# Patient Record
Sex: Male | Born: 1975 | Race: White | Hispanic: No | Marital: Married | Smoking: Current every day smoker
Health system: Southern US, Community
[De-identification: ages and names within clinical notes are randomized; demographics above are authoritative.]

## PROBLEM LIST (undated history)

## (undated) DIAGNOSIS — H409 Unspecified glaucoma: Secondary | ICD-10-CM

## (undated) DIAGNOSIS — J449 Chronic obstructive pulmonary disease, unspecified: Secondary | ICD-10-CM

## (undated) DIAGNOSIS — J45909 Unspecified asthma, uncomplicated: Secondary | ICD-10-CM

## (undated) DIAGNOSIS — Z8709 Personal history of other diseases of the respiratory system: Secondary | ICD-10-CM

## (undated) DIAGNOSIS — J439 Emphysema, unspecified: Secondary | ICD-10-CM

## (undated) HISTORY — PX: KIDNEY SURGERY: SHX687

---

## 2003-03-29 ENCOUNTER — Emergency Department (HOSPITAL_COMMUNITY): Admission: AD | Admit: 2003-03-29 | Discharge: 2003-03-29 | Payer: Self-pay | Admitting: Internal Medicine

## 2007-05-01 ENCOUNTER — Other Ambulatory Visit: Payer: Self-pay

## 2007-05-01 ENCOUNTER — Emergency Department: Payer: Self-pay | Admitting: Emergency Medicine

## 2007-05-09 ENCOUNTER — Emergency Department: Payer: Self-pay | Admitting: Emergency Medicine

## 2007-06-19 ENCOUNTER — Emergency Department: Payer: Self-pay | Admitting: Emergency Medicine

## 2012-01-27 ENCOUNTER — Emergency Department: Payer: Self-pay | Admitting: Emergency Medicine

## 2014-03-22 ENCOUNTER — Inpatient Hospital Stay: Payer: Self-pay | Admitting: Psychiatry

## 2014-03-22 LAB — DRUG SCREEN, URINE
AMPHETAMINES, UR SCREEN: NEGATIVE (ref ?–1000)
Barbiturates, Ur Screen: NEGATIVE (ref ?–200)
Benzodiazepine, Ur Scrn: NEGATIVE (ref ?–200)
COCAINE METABOLITE, UR ~~LOC~~: NEGATIVE (ref ?–300)
Cannabinoid 50 Ng, Ur ~~LOC~~: POSITIVE (ref ?–50)
MDMA (ECSTASY) UR SCREEN: NEGATIVE (ref ?–500)
METHADONE, UR SCREEN: NEGATIVE (ref ?–300)
Opiate, Ur Screen: NEGATIVE (ref ?–300)
Phencyclidine (PCP) Ur S: NEGATIVE (ref ?–25)
Tricyclic, Ur Screen: NEGATIVE (ref ?–1000)

## 2014-03-22 LAB — COMPREHENSIVE METABOLIC PANEL
ANION GAP: 6 — AB (ref 7–16)
Albumin: 3.9 g/dL (ref 3.4–5.0)
Alkaline Phosphatase: 94 U/L
BUN: 15 mg/dL (ref 7–18)
Bilirubin,Total: 0.5 mg/dL (ref 0.2–1.0)
CALCIUM: 8.7 mg/dL (ref 8.5–10.1)
CHLORIDE: 103 mmol/L (ref 98–107)
CREATININE: 0.95 mg/dL (ref 0.60–1.30)
Co2: 29 mmol/L (ref 21–32)
EGFR (African American): >60
EGFR (Non-African Amer.): >60
GLUCOSE: 113 mg/dL — AB (ref 65–99)
Osmolality: 277 (ref 275–301)
Potassium: 3.6 mmol/L (ref 3.5–5.1)
SGOT(AST): 29 U/L (ref 15–37)
SGPT (ALT): 39 U/L
SODIUM: 138 mmol/L (ref 136–145)
Total Protein: 7.4 g/dL (ref 6.4–8.2)

## 2014-03-22 LAB — URINALYSIS, COMPLETE
BACTERIA: NONE SEEN
BLOOD: NEGATIVE
Bilirubin,UR: NEGATIVE
Glucose,UR: NEGATIVE mg/dL (ref 0–75)
LEUKOCYTE ESTERASE: NEGATIVE
Nitrite: NEGATIVE
Ph: 6 (ref 4.5–8.0)
RBC,UR: 2 /HPF (ref 0–5)
Specific Gravity: 1.02 (ref 1.003–1.030)
WBC UR: 1 /HPF (ref 0–5)

## 2014-03-22 LAB — ACETAMINOPHEN LEVEL

## 2014-03-22 LAB — CBC
HCT: 46.1 % (ref 40.0–52.0)
HGB: 15.3 g/dL (ref 13.0–18.0)
MCH: 30.2 pg (ref 26.0–34.0)
MCHC: 33.3 g/dL (ref 32.0–36.0)
MCV: 91 fL (ref 80–100)
Platelet: 272 10*3/uL (ref 150–440)
RBC: 5.08 10*6/uL (ref 4.40–5.90)
RDW: 12.7 % (ref 11.5–14.5)
WBC: 14.5 10*3/uL — ABNORMAL HIGH (ref 3.8–10.6)

## 2014-03-22 LAB — ETHANOL: Ethanol: <3 mg/dL

## 2014-03-22 LAB — SALICYLATE LEVEL: SALICYLATES, SERUM: 3.5 mg/dL — AB

## 2014-03-27 LAB — LITHIUM LEVEL: LITHIUM: 0.57 mmol/L — AB

## 2014-03-27 LAB — VALPROIC ACID LEVEL: VALPROIC ACID: 49 ug/mL — AB

## 2014-09-08 NOTE — Discharge Summary (Signed)
PATIENT NAME:  Zachary Downs, Zachary Downs MR#:  [id number] DATE OF BIRTH:  1975/12/07  DATE OF ADMISSION:  03/22/2014 DATE OF DISCHARGE:  03/27/2014  IDENTIFYING INFORMATION: The patient is a 39 year old single Caucasian male from [place], [place], who is currently homeless and unemployed.   CHIEF COMPLAINT: "I lost control again."   DISCHARGE DIAGNOSES: Major depressive disorder, severe, recurrent. Posttraumatic stress disorder. Cannabis use disorder, severe. Antisocial personality traits. Overweight. Emphysema. History of seizures as a child.   DISCHARGE MEDICATIONS: Fluoxetine 20 mg p.o. daily, lithium CR 450 mg p.o. b.i.d. for suicidality, pantoprazole 40 mg p.o. daily for GERD, prazosin 1 mg p.o. at bedtime for PTSD-related nightmares, trazodone 200 mg p.o. at bedtime for insomnia, valproic acid 250 mg p.o. 3 times a day for seizures.   HOSPITAL COURSE: This patient presented to the Emergency Department at [facility] on November 5 reporting worsening depression and suicidal ideation. The patient stated that he has a long history of depression that started in the 90s; however, he stated that his depression worsened after he found his father dead by hanging. Recently, his cousin also committed suicide this February and that triggered a lot of the memories of his father passing away. He explained that he found his father and cut the rope. The patient reported symptoms congruent with major depressive disorder. He reported having chronic suicidal ideation. The patient also reported symptoms consistent with posttraumatic stress disorder as a result of finding his father. He describes having flashbacks, nightmares, hypervigilance. The patient reported a history of seizures as a child and having 2 seizures as an adult, but stated that he was not on any treatment for seizure disorder. The patient did appear medication seeking at times; on the first assessment he requested alprazolam and the day prior  to discharge he requested Adderall. None of these medications were prescribed to him and it was explained that these were controlled substances. For depressive symptoms, the patient was started on fluoxetine 20 mg for insomnia. He was continued on trazodone 200 mg p.o. at bedtime. For chronic suicidality, he was started on lithium 450 mg p.o. b.i.d. extended release. For nightmares, he was started on prazosin 1 mg p.o. at bedtime. For seizure disorder, he was started on Depakote 250 mg p.o. 3 times a day and for acid reflux, he was restarted on pantoprazole. The patient tolerated well the medications. He only complained of having some nausea secondary to the Depakote; however, this stopped after he was prescribed with pantoprazole. This hospitalization was uneventful. The patient did not have any behavioral problems during his stay. He did participate in groups and participated actively. He did seem to improve after a few days in the unit. However, the patient whenever he was assessed he was reporting not feeling much improved from the medications; however, he did not appear as tearful and depressed as when he was seen initially, and once again he was participating actively in groups and talking about his emotions. 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 He stated that his friend was coming down from [place] and he was going to pick him up and take him to [place] and he was going to get paid for 3 weeks of work. The patient was very hopeful and excited about this new job opportunity. He felt stable and offers discharge. He continued to report suicidal ideation; however, once again this has been the case for several years, even  before he found his father finding. On the day of the discharge, the patient denied problems with sleep, appetite, energy or concentration. He denied any physical complaints and denied having any side effects from his  medications. He reported his mood as improved slightly, denied homicidality or psychosis. He continued to voice suicidal ideation, but no intentions of harming himself at the time.   MENTAL STATUS EXAMINATION ON THE DAY OF THE DISCHARGE: The patient was alert and oriented in person, place, time and situation. Appearance: The patient was an obese 39 year old who appeared older than his stated age. He has multiple colorful tattoos on his neck and arms. Behavior was calm, pleasant, cooperative. Psychomotor activity within normal range. Thought content within normal range. Speech regular tone, volume, rate. Thought process is linear and goal directed. Thought content: Negative for suicidality and homicidality. Perception is negative for psychosis. Mood: Dysphoric, mild. Affect: Reactive, appropriate. Insight and judgment are fair. Cognitive examination: Once again, alert and oriented in person, place, time and situation. Fund of knowledge appears to be average for his level of education and attention and concentration appear to be intact; however they were not formally tested.   LABORATORY RESULTS: BUN 15, creatinine 0.95. Sodium 138, potassium 3.6, calcium 8.7. Alcohol was below detection limit at arrival. AST 29, ALT 39. Lithium on the day of the discharge was 0.57, valproic acid was 49,000. Urinalysis was positive for cannabis at arrival. WBC 14.5, hemoglobin 15.3, hematocrit 46.1, platelet count 272,000. UA is clear. Acetaminophen levels and salicylate levels were below detection limit.   DISCHARGE DISPOSITION: The patient will be discharged to a hotel that will be paid by his friend.   DISCHARGE FOLLOWUP: The patient has an upcoming appointment with [facility] on November 30. The social worker also provide the patient with a list of psychiatric resources for follow up in [place], [place]. The patient received a 7 day supply free of medications as he did not have any  insurance.   ____________________________ Jimmy FootmanAndrea Hernandez-Gonzalez, MD ahg:TT D: 03/27/2014 16:57:24 ET T: 03/27/2014 19:42:15 ET JOB#: [id number]  cc: Jimmy FootmanAndrea Hernandez-Gonzalez, MD, <Dictator> Horton ChinANDREA HERNANDEZ GONZAL MD ELECTRONICALLY SIGNED 03/28/2014 14:45

## 2014-09-08 NOTE — Consult Note (Signed)
PATIENT NAME:  Zachary ClimesHELMS, Garris D MR#:  [id number] DATE OF BIRTH:  1975/08/12  DATE OF CONSULTATION:  03/22/2014  REFERRING PHYSICIAN:   CONSULTING PHYSICIAN:  Audery AmelJohn T. Pervis Macintyre, MD  IDENTIFYING INFORMATION AND REASON FOR CONSULTATION: A 39 year old man who reports a past history of depression and PTSD. Consultation for depression and suicidal ideation.   HISTORY OF PRESENT ILLNESS: The patient states that he is feeling severely depressed and that he has been just getting worse for months. It has been going on for at least 4 years at a severe level. He has no enjoyment in any activities. Feels no optimism, feels no motivation to do anything. Constantly, he says there is a voice in his head that is talking negatively to  him. He says that he has visions of suicide almost all the time. He claims to be currently taking medications that were prescribed for him by the last facility he attended, which are currently Seroquel, Haldol, Cogentin and trazodone. He recently relocated to the [place] area. Right now, does not think he has a stable place to stay. Denies that he has been abusing drugs or alcohol. He reports that his depression, in his mind, dates back to having found his father's body after his father committed suicide several years ago.   PAST PSYCHIATRIC HISTORY: Reports that he was first in a psychiatric hospital in fifth grade and the second time in 1997. More recently, he has been in the [facility] [place]. He says he has been on trazodone, Seroquel, Haldol, Cogentin, Saphris and Celexa and cannot remember any other medicines he has taken. He reports that he does have a past history of suicide attempts. Denies any history of violence.   PAST MEDICAL HISTORY: No significant medical problems.   SOCIAL HISTORY: He says that he recently split up with a woman who threw him out of the house down in the [place] area. He came [place] apparently because he had some cousins up here. He  has not been able to work because he cannot hold a job in a long time. Feels like he has minimal to no support.   SUBSTANCE ABUSE HISTORY: Reports that he currently does not drink or use any drugs, minimizes any past drug or alcohol use.   FAMILY HISTORY: Positive for father and cousin who committed suicide.   CURRENT MEDICATIONS: He does not know doses of anything, but says he takes trazodone, Seroquel, Haldol and Cogentin. He is unclear about exactly what the schedule of it is as well.   ALLERGIES: PENICILLIN.   REVIEW OF SYSTEMS: Very depressed. He feels like he has a voice inside his head, but does not have any hallucinations. He has suicidal ideation. Feels down most of the time. Physically does not report any specific medical symptoms.   MENTAL STATUS EXAMINATION: A 39 year old man who looks his stated age. Reasonably well groomed. Covered with a dramatic and unusual set of tattoos. He makes minimal eye contact. Psychomotor activity is normal. Speech is normal in rate, tone and volume. Affect is sad, dysphoric, at times almost tearful. Mood is stated as depressed. Thoughts appear to be lucid. No evidence of loosening of associations or delusions. Denies auditory or visual hallucinations. Endorses suicidal ideation. No homicidal ideation. Can remember 3/3 objects immediately and at 3 minutes. He is alert and oriented x 4. Appears to have normal fund of knowledge. Judgment and insight reasonable.   LABORATORY RESULTS: The alcohol level is negative. Chemistry panel is all normal. Drug  screen positive for cannabis. CBC: Elevated white count at 14.5, otherwise unremarkable. Urinalysis: Positive protein, does not appear to be infected.   ASSESSMENT: A 39 year old man who reports symptoms of depression, claims he has been given a diagnosis of posttraumatic stress disorder in the past. He is really not reporting any truly psychotic symptoms right now and yet he claims that he has been on a multitude of  antipsychotics recently; I am not entirely sure what to make of that. His intensely reported suicidal thinking warrants hospitalization.   TREATMENT PLAN: Admit to psychiatry. Suicide precautions in place. I have chosen to give him 200 mg of Seroquel and 100 mg of trazodone at night for sleep, as well as p.r.n. Vistaril for anxiety. Primary team can make further medicine determinations.   DIAGNOSIS, PRINCIPAL AND PRIMARY:  AXIS I: Major depression.   SECONDARY DIAGNOSES:  AXIS I: Rule out posttraumatic stress disorder.  AXIS II: Deferred.  AXIS III: History of past emphysema.   ____________________________ Audery Amel, MD jtc:TT D: 03/22/2014 21:35:37 ET T: 03/22/2014 21:51:36 ET JOB#: [id number]  cc: Audery Amel, MD, <Dictator> Audery Amel MD ELECTRONICALLY SIGNED 03/24/2014 14:59
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
## 2015-03-24 ENCOUNTER — Emergency Department
Admission: EM | Admit: 2015-03-24 | Discharge: 2015-03-24 | Disposition: A | Discharge status: Home / Self Care | Payer: Medicaid Other | Attending: Emergency Medicine | Admitting: Emergency Medicine

## 2015-03-24 ENCOUNTER — Encounter: Payer: Self-pay | Admitting: Emergency Medicine

## 2015-03-24 DIAGNOSIS — Z880 Allergy status to penicillin: Secondary | ICD-10-CM | POA: Insufficient documentation

## 2015-03-24 DIAGNOSIS — H1033 Unspecified acute conjunctivitis, bilateral: Secondary | ICD-10-CM | POA: Diagnosis not present

## 2015-03-24 DIAGNOSIS — R739 Hyperglycemia, unspecified: Secondary | ICD-10-CM

## 2015-03-24 DIAGNOSIS — H578 Other specified disorders of eye and adnexa: Secondary | ICD-10-CM | POA: Diagnosis present

## 2015-03-24 DIAGNOSIS — R350 Frequency of micturition: Secondary | ICD-10-CM | POA: Diagnosis not present

## 2015-03-24 HISTORY — DX: Unspecified glaucoma: H40.9

## 2015-03-24 HISTORY — DX: Emphysema, unspecified: J43.9

## 2015-03-24 HISTORY — DX: Personal history of other diseases of the respiratory system: Z87.09

## 2015-03-24 LAB — GLUCOSE, CAPILLARY: GLUCOSE-CAPILLARY: 283 mg/dL — AB (ref 65–99)

## 2015-03-24 MED ORDER — CIPROFLOXACIN HCL 0.3 % OP SOLN
1.0000 [drp] | OPHTHALMIC | Status: DC
  Administered 2015-03-24: 1 [drp] via OPHTHALMIC
  Filled 2015-03-24: qty 2.5

## 2015-03-24 MED ORDER — CIPROFLOXACIN HCL 0.3 % OP SOLN: 1.0000 [drp] | OPHTHALMIC | Status: AC

## 2015-03-24 NOTE — ED Notes (Signed)
Swelling to left eye   with drainage  States eye was matted shut this am

## 2015-03-24 NOTE — Discharge Instructions (Signed)
Bacterial Conjunctivitis Bacterial conjunctivitis (commonly called pink eye) is redness, soreness, or puffiness (inflammation) of the white part of your eye. It is caused by a germ called bacteria. These germs can easily spread from person to person (contagious). Your eye often will become red or pink. Your eye may also become irritated, watery, or have a thick discharge.  HOME CARE   Apply a cool, clean washcloth over closed eyelids. Do this for 10-20 minutes, 3-4 times a day while you have pain.  Gently wipe away any fluid coming from the eye with a warm, wet washcloth or cotton ball.  Wash your hands often with soap and water. Use paper towels to dry your hands.  Do not share towels or washcloths.  Change or wash your pillowcase every day.  Do not use eye makeup until the infection is gone.  Do not use machines or drive if your vision is blurry.  Stop using contact lenses. Do not use them again until your doctor says it is okay.  Do not touch the tip of the eye drop bottle or medicine tube with your fingers when you put medicine on the eye. GET HELP RIGHT AWAY IF:   Your eye is not better after 3 days of starting your medicine.  You have a yellowish fluid coming out of the eye.  You have more pain in the eye.  Your eye redness is spreading.  Your vision becomes blurry.  You have a fever or lasting symptoms for more than 2-3 days.  You have a fever and your symptoms suddenly get worse.  You have pain in the face.  Your face gets red or puffy (swollen). MAKE SURE YOU:   Understand these instructions.  Will watch this condition.  Will get help right away if you are not doing well or get worse.   This information is not intended to replace advice given to you by your health care provider. Make sure you discuss any questions you have with your health care provider.   Document Released: 02/11/2008 Document Revised: 04/20/2012 Document Reviewed: 01/08/2012 Elsevier  Interactive Patient Education 2016 [employer]  

## 2015-03-24 NOTE — ED Provider Notes (Signed)
[facility] Emergency Department Provider Note ____________________________________________  Time seen: Approximately 11:25 AM  I have reviewed the triage vital signs and the nursing notes.   HISTORY  Chief Complaint Eye Problem    HPI Zachary Downs is a 39 y.o. male who presents to the emergency department for left pain and drainage. Additionally, he would like to have his blood sugar checked due to intermittent episodes of dizziness and a strong family history of diabetes. Eye pain and drainage started 3 or 4 days ago on the left side, and it is now spread into the right eye. He reports that he has a history of glaucoma and has had significant vision loss in the left eye, so he has not been concerned about the change until he awakened this morning with the eye matted shut. He is not following up with ophthalmology and has had no treatment for his glaucoma since being diagnosed "in a different ER a long time ago."   Past Medical History  Diagnosis Date  . H/O emphysema   . Glaucoma     There are no active problems to display for this patient.   No past surgical history on file.  Current Outpatient Rx  Name  Route  Sig  Dispense  Refill  . ciprofloxacin (CILOXAN) 0.3 % ophthalmic solution   Right Eye   Place 1 drop into the right eye every 2 (two) hours. Administer 1 drop, every 2 hours, while awake, for 2 days. Then 1 drop, every 4 hours, while awake, for the next 5 days.   5 mL   0     Allergies Penicillins  No family history on file.  Social History Social History  Substance Use Topics  . Smoking status: None  . Smokeless tobacco: None  . Alcohol Use: None    Review of Systems Constitutional: No fever/chills Eyes: No visual changes. Matting and drainage today. ENT: No sore throat. Cardiovascular: Denies chest pain. Respiratory: Denies shortness of breath. Gastrointestinal: No abdominal pain.  No nausea, no vomiting.  No diarrhea.   No constipation. Genitourinary: Negative for dysuria. Frequent urination Musculoskeletal: Negative for back pain. Skin: Negative for rash. Neurological: Negative for headaches, focal weakness or numbness.  10-point ROS otherwise negative.  ____________________________________________   PHYSICAL EXAM:  VITAL SIGNS: ED Triage Vitals  Enc Vitals Group     BP 03/24/15 1028 122/46 mmHg     Pulse Rate 03/24/15 1028 88     Resp 03/24/15 1028 20     Temp 03/24/15 1028 97.5 F (36.4 C)     Temp Source 03/24/15 1028 Oral     SpO2 03/24/15 1028 96 %     Weight 03/24/15 1028 245 lb (111.131 kg)     Height 03/24/15 1028 5\' 5"  (1.651 m)     Head Cir --      Peak Flow --      Pain Score 03/24/15 1031 6     Pain Loc --      Pain Edu? --      Excl. in GC? --     Constitutional: Alert and oriented. Well appearing and in no acute distress. Eyes: Conjunctivae are erythematous with purulent drainage noted in the lower lid bilaterally. Erythema and drainage is worse in the left eye. Head: Atraumatic. Nose: No congestion/rhinnorhea. Mouth/Throat: Mucous membranes are moist.  Oropharynx non-erythematous. Neck: No stridor.   Cardiovascular: Normal rate, regular rhythm. Grossly normal heart sounds.  Good peripheral circulation. Respiratory: Normal respiratory effort.  No retractions. Lungs CTAB. Gastrointestinal: Soft and nontender. No distention. No abdominal bruits. No CVA tenderness. Musculoskeletal: No lower extremity tenderness nor edema.  No joint effusions. Neurologic:  Normal speech and language. No gross focal neurologic deficits are appreciated. No gait instability. Skin:  Skin is warm, dry and intact. No rash noted. Psychiatric: Mood and affect are normal. Speech and behavior are normal.  ____________________________________________   LABS (all labs ordered are listed, but only abnormal results are displayed)  Labs Reviewed  GLUCOSE, CAPILLARY - Abnormal; Notable for the  following:    Glucose-Capillary 283 (*)    All other components within normal limits  CBG MONITORING, ED   ____________________________________________  EKG   ____________________________________________  RADIOLOGY   ____________________________________________   PROCEDURES  Procedure(s) performed: None  Critical Care performed: No  ____________________________________________   INITIAL IMPRESSION / ASSESSMENT AND PLAN / ED COURSE  Pertinent labs & imaging results that were available during my care of the patient were reviewed by me and considered in my medical decision making (see chart for details).  Patient was strongly encouraged to follow-up with ophthalmology. He was advised to schedule an appointment tomorrow for follow-up. He was advised that he will eventually lose vision if he does not comply with the recommended medications. Blood sugar was 283. Patient reports having eaten a sausage biscuit prior to arrival. He was advised that this reading as high and likely indicates type 2 diabetes, however he will need to follow-up outpatient for fasting labs. He was counseled regarding his diet. He tells me that he will not comply with the recommendations "because that is all I ever eat" in response to reviewing foods high in carbohydrates. ____________________________________________   FINAL CLINICAL IMPRESSION(S) / ED DIAGNOSES  Final diagnoses:  Hyperglycemia  Acute bacterial conjunctivitis of both eyes      Chinita Pester, FNP 03/24/15 1132  Sharyn Creamer, MD 03/24/15 1526

## 2015-03-24 NOTE — ED Notes (Signed)
Pt reports that he has had pain in his left eye x 3 days but didn't really notice because he has glaucoma. States that he awakened this morning with his eye matted shut. He states his left eye is hurting and is causing his right eye to water. NAD noted.

## 2015-03-24 NOTE — ED Notes (Signed)
Pt discharged home after verbalizing understanding of discharge instructions; nad noted. 

## 2016-09-14 ENCOUNTER — Encounter: Payer: Self-pay | Admitting: *Deleted

## 2016-09-14 ENCOUNTER — Ambulatory Visit
Admission: EM | Admit: 2016-09-14 | Discharge: 2016-09-14 | Disposition: A | Discharge status: Home / Self Care | Payer: Medicaid Other | Attending: Family Medicine | Admitting: Family Medicine

## 2016-09-14 DIAGNOSIS — H409 Unspecified glaucoma: Secondary | ICD-10-CM | POA: Diagnosis not present

## 2016-09-14 DIAGNOSIS — W57XXXA Bitten or stung by nonvenomous insect and other nonvenomous arthropods, initial encounter: Secondary | ICD-10-CM

## 2016-09-14 DIAGNOSIS — F17200 Nicotine dependence, unspecified, uncomplicated: Secondary | ICD-10-CM | POA: Diagnosis not present

## 2016-09-14 DIAGNOSIS — L02429 Furuncle of limb, unspecified: Secondary | ICD-10-CM | POA: Diagnosis not present

## 2016-09-14 DIAGNOSIS — L02439 Carbuncle of limb, unspecified: Secondary | ICD-10-CM | POA: Diagnosis not present

## 2016-09-14 DIAGNOSIS — S40861A Insect bite (nonvenomous) of right upper arm, initial encounter: Secondary | ICD-10-CM | POA: Diagnosis not present

## 2016-09-14 DIAGNOSIS — J439 Emphysema, unspecified: Secondary | ICD-10-CM | POA: Insufficient documentation

## 2016-09-14 DIAGNOSIS — R5383 Other fatigue: Secondary | ICD-10-CM

## 2016-09-14 LAB — GLUCOSE, CAPILLARY: GLUCOSE-CAPILLARY: 157 mg/dL — AB (ref 65–99)

## 2016-09-14 MED ORDER — DOXYCYCLINE HYCLATE 100 MG PO TABS: 100.0000 mg | ORAL_TABLET | Freq: Two times a day (BID) | ORAL | 0 refills | Status: DC

## 2016-09-14 NOTE — ED Provider Notes (Signed)
[facility]    CSN: [id number] Arrival date & time: 09/14/16  1233     History   Chief Complaint Chief Complaint  Patient presents with  . Insect Bite  . Cough  . Fatigue  . Skin Ulcer    HPI Zachary Downs is a 41 y.o. male.   41 yo male with a c/o tick bite on right axilla 2 weeks ago. States tick was not engorged and patient does not know how long tick was embedded. Also c/o fatigue, malaise, mild nausea, chills for the past week. Denies chest pains or shortness of breath.    The history is provided by the patient.    Past Medical History:  Diagnosis Date  . Glaucoma   . H/O emphysema     There are no active problems to display for this patient.   History reviewed. No pertinent surgical history.     Home Medications    Prior to Admission medications   Medication Sig Start Date End Date Taking? Authorizing Provider  doxycycline (VIBRA-TABS) 100 MG tablet Take 1 tablet (100 mg total) by mouth 2 (two) times daily. 09/14/16   Payton Mccallum, MD    Family History History reviewed. No pertinent family history.  Social History Social History  Substance Use Topics  . Smoking status: Current Every Day Smoker  . Smokeless tobacco: Never Used  . Alcohol use No     Allergies   Penicillins   Review of Systems Review of Systems   Physical Exam Triage Vital Signs ED Triage Vitals  Enc Vitals Group     BP 09/14/16 1310 133/81     Pulse Rate 09/14/16 1310 72     Resp 09/14/16 1310 16     Temp 09/14/16 1310 98 F (36.7 C)     Temp Source 09/14/16 1310 Oral     SpO2 09/14/16 1310 98 %     Weight 09/14/16 1313 220 lb (99.8 kg)     Height 09/14/16 1313  (1.676 m)     Head Circumference --      Peak Flow --      Pain Score --      Pain Loc --      Pain Edu? --      Excl. in GC? --    No data found.   Updated Vital Signs BP 133/81 (BP Location: Left Arm)   Pulse 72   Temp 98 F (36.7 C) (Oral)   Resp 16   Ht  (1.676 m)    Wt 220 lb (99.8 kg)   SpO2 98%   BMI 35.51 kg/m   Visual Acuity Right Eye Distance:   Left Eye Distance:   Bilateral Distance:    Right Eye Near:   Left Eye Near:    Bilateral Near:     Physical Exam  Constitutional: He appears well-developed and well-nourished. No distress.  Cardiovascular: Normal rate, regular rhythm, normal heart sounds and intact distal pulses.   Pulmonary/Chest: Effort normal and breath sounds normal.  Skin: He is not diaphoretic.  2 discreet skin lesions near right axilla; erythematous and tender; no bull's eye rash; no drainage  Nursing note and vitals reviewed.    UC Treatments / Results  Labs (all labs ordered are listed, but only abnormal results are displayed) Labs Reviewed  GLUCOSE, CAPILLARY - Abnormal; Notable for the following:       Result Value   Glucose-Capillary 157 (*)    All other components  within normal limits  ROCKY MTN SPOTTED FVR ABS PNL(IGG+IGM)  B. BURGDORFI ANTIBODIES  CBG MONITORING, ED    EKG  EKG Interpretation None       Radiology No results found.  Procedures Procedures (including critical care time)  Medications Ordered in UC Medications - No data to display   Initial Impression / Assessment and Plan / UC Course  I have reviewed the triage vital signs and the nursing notes.  Pertinent labs & imaging results that were available during my care of the patient were reviewed by me and considered in my medical decision making (see chart for details).       Final Clinical Impressions(s) / UC Diagnoses   Final diagnoses:  Tick bite, initial encounter  Other fatigue  Boil, arm    New Prescriptions Discharge Medication List as of 09/14/2016  1:40 PM    START taking these medications   Details  doxycycline (VIBRA-TABS) 100 MG tablet Take 1 tablet (100 mg total) by mouth 2 (two) times daily., Starting Mon 09/14/2016, Normal       1. diagnosis reviewed with patient 2. rx as per orders above;  reviewed possible side effects, interactions, risks and benefits  3. Check labs as per orders 4. Recommend supportive treatment with rest, fluids  5. Follow-up prn if symptoms worsen or don't improve   Payton Mccallum, MD 09/14/16 1731

## 2016-09-14 NOTE — ED Triage Notes (Signed)
Also, pt has had loose stools with cough incontinence at times.

## 2016-09-14 NOTE — ED Triage Notes (Signed)
Pt removed a tick from right axilla 2 weeks. Since then 2 skin ulcers have developed, both to right axilla. Also, pt has developed gen fatigue/maliase, mild persistent nausea, productive cough-yellow/ brown, chills, body aches.

## 2016-09-15 LAB — ROCKY MTN SPOTTED FVR ABS PNL(IGG+IGM)
RMSF IGG: NEGATIVE
RMSF IgM: 1.73 index — ABNORMAL HIGH (ref 0.00–0.89)

## 2016-09-15 LAB — B. BURGDORFI ANTIBODIES: B burgdorferi Ab IgG+IgM: <0.91 {ISR} (ref 0.00–0.90)

## 2017-05-11 ENCOUNTER — Emergency Department: Payer: No Typology Code available for payment source

## 2017-05-11 ENCOUNTER — Other Ambulatory Visit: Payer: Self-pay

## 2017-05-11 ENCOUNTER — Encounter: Payer: Self-pay | Admitting: Emergency Medicine

## 2017-05-11 ENCOUNTER — Emergency Department
Admission: EM | Admit: 2017-05-11 | Discharge: 2017-05-11 | Disposition: A | Discharge status: Home / Self Care | Payer: No Typology Code available for payment source | Attending: Student in an Organized Health Care Education/Training Program | Admitting: Student in an Organized Health Care Education/Training Program

## 2017-05-11 DIAGNOSIS — Y999 Unspecified external cause status: Secondary | ICD-10-CM | POA: Insufficient documentation

## 2017-05-11 DIAGNOSIS — Y92410 Unspecified street and highway as the place of occurrence of the external cause: Secondary | ICD-10-CM | POA: Diagnosis not present

## 2017-05-11 DIAGNOSIS — Z79899 Other long term (current) drug therapy: Secondary | ICD-10-CM | POA: Insufficient documentation

## 2017-05-11 DIAGNOSIS — Y9389 Activity, other specified: Secondary | ICD-10-CM | POA: Diagnosis not present

## 2017-05-11 DIAGNOSIS — F17200 Nicotine dependence, unspecified, uncomplicated: Secondary | ICD-10-CM | POA: Insufficient documentation

## 2017-05-11 DIAGNOSIS — M79644 Pain in right finger(s): Secondary | ICD-10-CM | POA: Insufficient documentation

## 2017-05-11 DIAGNOSIS — S72114A Nondisplaced fracture of greater trochanter of right femur, initial encounter for closed fracture: Secondary | ICD-10-CM | POA: Diagnosis not present

## 2017-05-11 DIAGNOSIS — S79911A Unspecified injury of right hip, initial encounter: Secondary | ICD-10-CM | POA: Diagnosis present

## 2017-05-11 LAB — CBC WITH DIFFERENTIAL/PLATELET
Basophils Absolute: 0 10*3/uL (ref 0–0.1)
Basophils Relative: 0 %
EOS ABS: 0.2 10*3/uL (ref 0–0.7)
EOS PCT: 2 %
HCT: 47.1 % (ref 40.0–52.0)
Hemoglobin: 16 g/dL (ref 13.0–18.0)
LYMPHS PCT: 11 %
Lymphs Abs: 1.4 10*3/uL (ref 1.0–3.6)
MCH: 30.5 pg (ref 26.0–34.0)
MCHC: 33.9 g/dL (ref 32.0–36.0)
MCV: 89.9 fL (ref 80.0–100.0)
MONO ABS: 0.4 10*3/uL (ref 0.2–1.0)
Monocytes Relative: 3 %
NEUTROS ABS: 11 10*3/uL — AB (ref 1.4–6.5)
NEUTROS PCT: 84 %
PLATELETS: 284 10*3/uL (ref 150–440)
RBC: 5.24 MIL/uL (ref 4.40–5.90)
RDW: 13.3 % (ref 11.5–14.5)
WBC: 13.2 10*3/uL — ABNORMAL HIGH (ref 3.8–10.6)

## 2017-05-11 LAB — BASIC METABOLIC PANEL
Anion gap: 8 (ref 5–15)
BUN: 18 mg/dL (ref 6–20)
CALCIUM: 9.3 mg/dL (ref 8.9–10.3)
CO2: 27 mmol/L (ref 22–32)
CREATININE: 0.94 mg/dL (ref 0.61–1.24)
Chloride: 100 mmol/L — ABNORMAL LOW (ref 101–111)
GFR calc Af Amer: >60 mL/min (ref >60)
Glucose, Bld: 168 mg/dL — ABNORMAL HIGH (ref 65–99)
Potassium: 3.8 mmol/L (ref 3.5–5.1)
SODIUM: 135 mmol/L (ref 135–145)

## 2017-05-11 MED ORDER — HYDROCODONE-ACETAMINOPHEN 5-325 MG PO TABS
1.0000 | ORAL_TABLET | Freq: Once | ORAL | Status: AC
  Administered 2017-05-11: 1 via ORAL
  Filled 2017-05-11: qty 1

## 2017-05-11 MED ORDER — KETOROLAC TROMETHAMINE 30 MG/ML IJ SOLN
15.0000 mg | Freq: Once | INTRAMUSCULAR | Status: AC
  Administered 2017-05-11: 15 mg via INTRAVENOUS
  Filled 2017-05-11: qty 1

## 2017-05-11 MED ORDER — IOPAMIDOL (ISOVUE-370) INJECTION 76%
85.0000 mL | Freq: Once | INTRAVENOUS | Status: AC | PRN
  Administered 2017-05-11: 85 mL via INTRAVENOUS

## 2017-05-11 MED ORDER — FENTANYL CITRATE (PF) 100 MCG/2ML IJ SOLN
100.0000 ug | INTRAMUSCULAR | Status: DC | PRN
  Administered 2017-05-11: 100 ug via INTRAVENOUS
  Filled 2017-05-11: qty 2

## 2017-05-11 MED ORDER — CYCLOBENZAPRINE HCL 10 MG PO TABS: 10.0000 mg | ORAL_TABLET | Freq: Three times a day (TID) | ORAL | 0 refills | Status: DC | PRN

## 2017-05-11 MED ORDER — HYDROCODONE-ACETAMINOPHEN 5-325 MG PO TABS: 1.0000 | ORAL_TABLET | ORAL | 0 refills | Status: DC | PRN

## 2017-05-11 NOTE — ED Notes (Signed)
Pt ambulatory with crutches to wheelchair upon discharge. This RN wheeled pt out to lobby to wait for his ride. Verbalized understanding of discharge instructions, prescriptions and follow-up care. VSS. Skin warm and dry. A&O x4.

## 2017-05-11 NOTE — ED Notes (Signed)
Patient transported to XR. 
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
## 2017-05-11 NOTE — Discharge Instructions (Addendum)
You may bear weight on the right leg as tolerated.  Please use crutches or other assistive device if there is pain.  Return to the ER if pain is unable to be controlled with oral medications.  Follow-up with orthopedic clinic in 1 week for repeat evaluation.
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
## 2017-05-11 NOTE — ED Notes (Signed)
Patient transported to CT 

## 2017-12-22 ENCOUNTER — Ambulatory Visit
Admit: 2017-12-22 | Discharge: 2017-12-22 | Disposition: A | Discharge status: Home / Self Care | Payer: Medicaid Other | Attending: Emergency Medicine | Admitting: Emergency Medicine

## 2017-12-22 ENCOUNTER — Ambulatory Visit
Admission: EM | Admit: 2017-12-22 | Discharge: 2017-12-22 | Disposition: A | Discharge status: Home / Self Care | Payer: Medicaid Other | Attending: Emergency Medicine | Admitting: Emergency Medicine

## 2017-12-22 ENCOUNTER — Ambulatory Visit: Payer: Medicaid Other

## 2017-12-22 ENCOUNTER — Encounter: Payer: Self-pay | Admitting: Emergency Medicine

## 2017-12-22 ENCOUNTER — Other Ambulatory Visit: Payer: Self-pay

## 2017-12-22 DIAGNOSIS — R109 Unspecified abdominal pain: Secondary | ICD-10-CM

## 2017-12-22 DIAGNOSIS — R0789 Other chest pain: Secondary | ICD-10-CM | POA: Diagnosis not present

## 2017-12-22 DIAGNOSIS — J449 Chronic obstructive pulmonary disease, unspecified: Secondary | ICD-10-CM | POA: Insufficient documentation

## 2017-12-22 DIAGNOSIS — R0602 Shortness of breath: Secondary | ICD-10-CM | POA: Diagnosis not present

## 2017-12-22 LAB — COMPREHENSIVE METABOLIC PANEL
ALK PHOS: 79 U/L (ref 38–126)
ALT: 32 U/L (ref 0–44)
AST: 23 U/L (ref 15–41)
Albumin: 4.1 g/dL (ref 3.5–5.0)
Anion gap: 13 (ref 5–15)
BUN: 19 mg/dL (ref 6–20)
CALCIUM: 9.6 mg/dL (ref 8.9–10.3)
CO2: 27 mmol/L (ref 22–32)
CREATININE: 0.86 mg/dL (ref 0.61–1.24)
Chloride: 98 mmol/L (ref 98–111)
GFR calc Af Amer: >60 mL/min (ref >60)
GFR calc non Af Amer: >60 mL/min (ref >60)
Glucose, Bld: 188 mg/dL — ABNORMAL HIGH (ref 70–99)
Potassium: 4 mmol/L (ref 3.5–5.1)
SODIUM: 138 mmol/L (ref 135–145)
Total Bilirubin: 0.5 mg/dL (ref 0.3–1.2)
Total Protein: 7.2 g/dL (ref 6.5–8.1)

## 2017-12-22 LAB — CBC WITH DIFFERENTIAL/PLATELET
BASOS PCT: 0 %
Basophils Absolute: 0 10*3/uL (ref 0–0.1)
EOS ABS: 0.3 10*3/uL (ref 0–0.7)
Eosinophils Relative: 3 %
HCT: 43.8 % (ref 40.0–52.0)
HEMOGLOBIN: 15.1 g/dL (ref 13.0–18.0)
Lymphocytes Relative: 21 %
Lymphs Abs: 2 10*3/uL (ref 1.0–3.6)
MCH: 30.7 pg (ref 26.0–34.0)
MCHC: 34.3 g/dL (ref 32.0–36.0)
MCV: 89.4 fL (ref 80.0–100.0)
Monocytes Absolute: 0.7 10*3/uL (ref 0.2–1.0)
Monocytes Relative: 7 %
NEUTROS PCT: 69 %
Neutro Abs: 6.5 10*3/uL (ref 1.4–6.5)
Platelets: 257 10*3/uL (ref 150–440)
RBC: 4.9 MIL/uL (ref 4.40–5.90)
RDW: 13.2 % (ref 11.5–14.5)
WBC: 9.4 10*3/uL (ref 3.8–10.6)

## 2017-12-22 LAB — URINALYSIS, COMPLETE (UACMP) WITH MICROSCOPIC
BILIRUBIN URINE: NEGATIVE
Bacteria, UA: NONE SEEN
Glucose, UA: NEGATIVE mg/dL
KETONES UR: NEGATIVE mg/dL
NITRITE: NEGATIVE
PROTEIN: 100 mg/dL — AB
Specific Gravity, Urine: 1.02 (ref 1.005–1.030)
pH: 6 (ref 5.0–8.0)

## 2017-12-22 LAB — TROPONIN I: Troponin I: <0.03 ng/mL (ref <0.03)

## 2017-12-22 MED ORDER — ACETAMINOPHEN 500 MG PO TABS
1000.0000 mg | ORAL_TABLET | Freq: Once | ORAL | Status: AC
  Administered 2017-12-22: 1000 mg via ORAL

## 2017-12-22 MED ORDER — ALBUTEROL SULFATE HFA 108 (90 BASE) MCG/ACT IN AERS: 1.0000 | INHALATION_SPRAY | Freq: Four times a day (QID) | RESPIRATORY_TRACT | 0 refills | Status: DC | PRN

## 2017-12-22 MED ORDER — AEROCHAMBER PLUS MISC: 2 refills | Status: DC

## 2017-12-22 MED ORDER — HYDROCODONE-ACETAMINOPHEN 5-325 MG PO TABS: 1.0000 | ORAL_TABLET | Freq: Four times a day (QID) | ORAL | 0 refills | Status: DC | PRN

## 2017-12-22 MED ORDER — IBUPROFEN 600 MG PO TABS: 600.0000 mg | ORAL_TABLET | Freq: Four times a day (QID) | ORAL | 0 refills | Status: AC | PRN

## 2017-12-22 MED ORDER — KETOROLAC TROMETHAMINE 30 MG/ML IJ SOLN
30.0000 mg | Freq: Once | INTRAMUSCULAR | Status: AC
  Administered 2017-12-22: 30 mg via INTRAMUSCULAR

## 2017-12-22 NOTE — ED Triage Notes (Signed)
Called [employer] to prior auth CT Abd/Pelvis without contrast. Spoke with [place]. Auth # [id number] valid 12/22/17 to 01/21/18 and valid for 1 visit.

## 2017-12-22 NOTE — ED Provider Notes (Signed)
HPI  SUBJECTIVE:  Zachary Downs is a 42 y.o. male who presents with the acute onset of constant, waxing and waning left side/abdominal/flank pain waking him up from his sleep at 0300 this morning.  He reports shortness of breath and central, chest tightness that lasts seconds to minutes when the pain is bad.  He reports nausea, but no vomiting.  He states that the flank pain radiates into his back.  It does not radiate into his groin.  He reports new, different left-sided back pain for the past week.  No aggravating or alleviating factors.  He has not tried anything for this.  He denies vomiting, fevers.  No antipyretic in the past 4 to 6 hours.  He reports increased urine output/frequency over the past week.  States that he drinks coffee, sodas, sweet tea only.  No water.  He denies rash.  He denies dysuria, urgency, cloudy or odorous urine, hematuria.  No abdominal distention.  No trauma to the area of pain.  No coughing, wheezing, dyspnea on exertion, chest pain, pressure, heaviness.  This chest tightness does not radiate up his neck, down his arm, through to his back although he does report some intermittent tingling in his left arm and leg.  It is not not worse with exertion.  He denies calf pain, swelling, hemoptysis, surgery in the past 4 weeks, prolonged immobilization.  He denies belching, water brash.  He has never had symptoms like this before.  He is a 1 to 2 pack/day smoker for the past 25 years and has a provisional diagnosis of emphysema.  He had suspected diabetes with hyperglycemia found in the ER several years ago.  He has chronic back pain, nonobstructing nephrolithiasis and a history of GERD.  He states that his symptoms do not feel like GERD.  No history of hypertension, hypercholesterolemia, MI, A. fib, mesenteric ischemia.  No history of PE, DVT, UTI.  Family history negative for early MI to his knowledge.  PMD: None.  States that he does not ever see a doctor.   Past Medical  History:  Diagnosis Date  . Glaucoma   . H/O emphysema     History reviewed. No pertinent surgical history.  History reviewed. No pertinent family history.  Social History   Tobacco Use  . Smoking status: Current Every Day Smoker  . Smokeless tobacco: Never Used  Substance Use Topics  . Alcohol use: No  . Drug use: Yes    Types: Marijuana    Comment: last used 08/06 am    No current facility-administered medications for this encounter.   Current Outpatient Medications:  .  albuterol (PROVENTIL HFA;VENTOLIN HFA) 108 (90 Base) MCG/ACT inhaler, Inhale 1-2 puffs into the lungs every 6 (six) hours as needed for wheezing or shortness of breath., Disp: 1 Inhaler, Rfl: 0 .  HYDROcodone-acetaminophen (NORCO/VICODIN) 5-325 MG tablet, Take 1-2 tablets by mouth every 6 (six) hours as needed for moderate pain or severe pain., Disp: 12 tablet, Rfl: 0 .  ibuprofen (ADVIL,MOTRIN) 600 MG tablet, Take 1 tablet (600 mg total) by mouth every 6 (six) hours as needed., Disp: 30 tablet, Rfl: 0 .  Spacer/Aero-Holding Chambers (AEROCHAMBER PLUS) inhaler, Use as instructed, Disp: 1 each, Rfl: 2  Allergies  Allergen Reactions  . Penicillins      ROS  As noted in HPI.   Physical Exam  BP (!) 143/90 (BP Location: Right Arm)   Pulse (!) 109   Temp 97.8 F (36.6 C) (Oral)   Resp  18   Ht 5\' 6"  (1.676 m)   Wt 220 lb (99.8 kg)   SpO2 97%   BMI 35.51 kg/m   Constitutional: Well developed, well nourished, appears uncomfortable Eyes: PERRL, EOMI, conjunctiva normal bilaterally HENT: Normocephalic, atraumatic,mucus membranes moist Respiratory: Clear to auscultation bilaterally, wheezing left lower lobe Cardiovascular: Normal rate and rhythm, no murmurs, no gallops, no rubs or no chest wall tenderness GI: Soft, nondistended, normal bowel sounds, positive left flank tenderness, positive tenderness at the left UVJ, no rebound, no guarding.  negative McBurney Back: Positive left CVAT skin: No rash  over torso, skin intact Musculoskeletal: Calves symmetric, nontender, no edema  Neurologic: Alert & oriented x 3, CN II-XII grossly intact, no motor deficits, sensation grossly intact Psychiatric: Speech and behavior appropriate   ED Course   Medications  ketorolac (TORADOL) 30 MG/ML injection 30 mg (30 mg Intramuscular Given 12/22/17 1243)  acetaminophen (TYLENOL) tablet 1,000 mg (1,000 mg Oral Given 12/22/17 1242)    Orders Placed This Encounter  Procedures  . DG Chest 2 View    Standing Status:   Standing    Number of Occurrences:   1    Order Specific Question:   Reason for Exam (SYMPTOM  OR DIAGNOSIS REQUIRED)    Answer:   wheezing LLL, LUQ pain r/o PNA, pulm infarct  . CT Renal Stone Study    Standing Status:   Standing    Number of Occurrences:   1    Order Specific Question:   Radiology Contrast Protocol - do NOT remove file path    Answer:   \\[email or web address].pdf  . CT RENAL STONE STUDY    Pt can stay until the report is called     Order Specific Question:   Preferred imaging location?    Answer:   [facility] [place]    Order Specific Question:   Call Results- Best Contact Number?    Answer:   [phone]    Order Specific Question:   Radiology Contrast Protocol - do NOT remove file path    Answer:   \\[email or web address].pdf  . Comprehensive metabolic panel    Standing Status:   Standing    Number of Occurrences:   1  . Troponin I    Standing Status:   Standing    Number of Occurrences:   1  . Urinalysis, Complete w Microscopic    Standing Status:   Standing    Number of Occurrences:   1  . CBC with Differential    Standing Status:   Standing    Number of Occurrences:   1  . ED EKG    Standing Status:   Standing    Number of Occurrences:   1    Order Specific Question:   Reason for Exam    Answer:   Shortness of breath  . EKG 12-Lead    Standing Status:   Standing    Number of Occurrences:   1   Results  for orders placed or performed during the hospital encounter of 12/22/17 (from the past 24 hour(s))  Comprehensive metabolic panel     Status: Abnormal   Collection Time: 12/22/17 12:44 PM  Result Value Ref Range   Sodium 138 135 - 145 mmol/L   Potassium 4.0 3.5 - 5.1 mmol/L   Chloride 98 98 - 111 mmol/L   CO2 27 22 - 32 mmol/L   Glucose, Bld 188 (H) 70 - 99 mg/dL   BUN 19 6 - 20 mg/dL   Creatinine,  Ser 0.86 0.61 - 1.24 mg/dL   Calcium 9.6 8.9 - 13.2 mg/dL   Total Protein 7.2 6.5 - 8.1 g/dL   Albumin 4.1 3.5 - 5.0 g/dL   AST 23 15 - 41 U/L   ALT 32 0 - 44 U/L   Alkaline Phosphatase 79 38 - 126 U/L   Total Bilirubin 0.5 0.3 - 1.2 mg/dL   GFR calc non Af Amer >60 >60 mL/min   GFR calc Af Amer >60 >60 mL/min   Anion gap 13 5 - 15  Troponin I     Status: None   Collection Time: 12/22/17 12:44 PM  Result Value Ref Range   Troponin I <0.03 <0.03 ng/mL  Urinalysis, Complete w Microscopic     Status: Abnormal   Collection Time: 12/22/17 12:44 PM  Result Value Ref Range   Color, Urine YELLOW YELLOW   APPearance CLEAR CLEAR   Specific Gravity, Urine 1.020 1.005 - 1.030   pH 6.0 5.0 - 8.0   Glucose, UA NEGATIVE NEGATIVE mg/dL   Hgb urine dipstick TRACE (A) NEGATIVE   Bilirubin Urine NEGATIVE NEGATIVE   Ketones, ur NEGATIVE NEGATIVE mg/dL   Protein, ur 440 (A) NEGATIVE mg/dL   Nitrite NEGATIVE NEGATIVE   Leukocytes, UA SMALL (A) NEGATIVE   Squamous Epithelial / LPF 0-5 0 - 5   WBC, UA >50 0 - 5 WBC/hpf   RBC / HPF 0-5 0 - 5 RBC/hpf   Bacteria, UA NONE SEEN NONE SEEN  CBC with Differential     Status: None   Collection Time: 12/22/17 12:44 PM  Result Value Ref Range   WBC 9.4 3.8 - 10.6 K/uL   RBC 4.90 4.40 - 5.90 MIL/uL   Hemoglobin 15.1 13.0 - 18.0 g/dL   HCT 10.2 72.5 - 36.6 %   MCV 89.4 80.0 - 100.0 fL   MCH 30.7 26.0 - 34.0 pg   MCHC 34.3 32.0 - 36.0 g/dL   RDW 44.0 34.7 - 42.5 %   Platelets 257 150 - 440 K/uL   Neutrophils Relative % 69 %   Neutro Abs 6.5 1.4 - 6.5  K/uL   Lymphocytes Relative 21 %   Lymphs Abs 2.0 1.0 - 3.6 K/uL   Monocytes Relative 7 %   Monocytes Absolute 0.7 0.2 - 1.0 K/uL   Eosinophils Relative 3 %   Eosinophils Absolute 0.3 0 - 0.7 K/uL   Basophils Relative 0 %   Basophils Absolute 0.0 0 - 0.1 K/uL   Dg Chest 2 View  Result Date: 12/22/2017 CLINICAL DATA:  Awakened at 3 a.m. with shortness of breath and left lower lateral ribcage discomfort. History of COPD, previous episodes of pneumonia. Current smoker. EXAM: CHEST - 2 VIEW COMPARISON:  Chest x-ray of May 09, 2007 FINDINGS: The lungs are well-expanded with mild hemidiaphragm flattening. The heart and pulmonary vascularity are normal. The mediastinum is normal in width. There is no pleural effusion. The bony thorax exhibits no acute abnormality. IMPRESSION: COPD.  No acute cardiopulmonary abnormality. Electronically Signed   By: David  [place] M.D.   On: 12/22/2017 12:59   Ct Renal Stone Study  Result Date: 12/22/2017 CLINICAL DATA:  Acute onset of LEFT flank pain that began at 3 o'clock a.m. this morning. Chest tightness which also started early this morning. Low back pain that began 1 week ago. EXAM: CT ABDOMEN AND PELVIS WITHOUT CONTRAST TECHNIQUE: Multidetector CT imaging of the abdomen and pelvis was performed following the standard protocol without IV contrast. COMPARISON:  05/11/2017. FINDINGS: Lower chest: Heart size normal.  Visualized lung bases clear. Hepatobiliary: Normal unenhanced appearance of the liver. Gallbladder normal in appearance without calcified gallstones. No biliary ductal dilation. Pancreas: Normal unenhanced appearance. Spleen: Normal unenhanced appearance. Small focus of accessory splenic tissue MEDIAL to the LOWER pole as noted on the prior examination. Adrenals/Urinary Tract: Normal appearing adrenal glands. Dilation of the LEFT renal collecting system, unchanged, without evidence of a LEFT ureteral dilation and without evidence of obstructing calculus.  Within the limits of the unenhanced technique, no focal parenchymal abnormality involving either kidney. No RIGHT urinary tract calculi. Normal appearing decompressed urinary bladder. Stomach/Bowel: Stomach normal in appearance for the degree of distention. Normal-appearing small bowel. Mobile cecum positioned in the RIGHT UPPER QUADRANT of the abdomen. Descending colon, sigmoid colon and rectum decompressed. No focal abnormalities involving the colon. Normal appearing appendix in the RIGHT mid abdomen and RIGHT UPPER pelvis. Vascular/Lymphatic: No visible aorto-iliofemoral atherosclerosis. No pathologic lymphadenopathy. Reproductive: Prostate gland and seminal vesicles normal in size and appearance for age. Calcifications involving the vasa deferens as they exit the seminal vesicles. Other: None. Musculoskeletal: Spondylosis involving the visualized LOWER thoracic spine. Facet degenerative changes at L4-5 and L5-S1. No acute abnormalities. IMPRESSION: 1. No acute abnormalities involving the abdomen or pelvis. 2. Dilation of the LEFT renal collecting system, unchanged since December, 2018, without evidence of an obstructing calculus. 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 These results will be called to the ordering clinician or representative by the Radiologist Assistant, and communication documented in the PACS or zVision Dashboard. Electronically Signed   By: Hulan Saas M.D.   On: 12/22/2017 13:51    ED Clinical Impression  Acute left flank pain   ED Assessment/Plan  EKG: Normal sinus rhythm, rate 91.  Normal axis, normal intervals.  No hypertrophy.  No ST-T wave changes.  No change compared to EKG from 04/2007.  Presentation suggestive of renal colic.  Doubt gastritis, obstruction, mesenteric ischemia.  Will check UA, CBC, CMP, renal CT. the chest tightness seems to happen only when the abdominal/flank pain is bad, it does not radiate, he denies diaphoresis with it, he has a normal EKG.  He was  symptomatic when the EKG was obtained.  Suspicion for angina/cardiac cause of the chest tightness is low, he has had this intermittent chest tightness for 9, almost 10 hours, so we will do a troponin.  His calves are symmetric, nontender, most of his pain seems to be in his flank, and he has no risk factors for PE, doubt PE.  Did not check a d-dimer.  We will also check a chest x-ray because he does have wheezing in the left lower lobe.  30 mg of Toradol IM with 1000 mg of Tylenol p.o. for pain.  Will reevaluate.  Patient understands that if he has any significant abnormalities, he will be going to the ER.  He also understands that if his symptoms become worse or for any concerns, he will go to the ER.  [place] Narcotic database reviewed for this patient, and feel that the risk/benefit ratio today is favorable for proceeding with a prescription for controlled substance.  No opiate prescriptions in 2 years.  Troponin negative, CBC normal.  CMP remarkable for hyperglycemia at 188, otherwise normal.  UA  has hematuria, proteinuria, no bacteria no esterase or nitrites.  Reviewed imaging independently.  X-ray: COPD.  No pneumonia.  Renal stone study: Not crossing over, unable to see images.  Report reviewed.  Dilation of the left renal  collecting system, unchanged without evidence of left ureteral dilation without evidence of obstructing calculus.  This is not changed since December 2018.  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  Facet degenerative changes at L4-5 and L5-S1.  No acute abnormalities involving the abdomen, pelvis. See radiology report for full details.  Gave patient copy of his CT results.  Reevaluation, he states that he feels a little better.  Unsure as to the cause of his symptoms.  He does not have a stone or other any abdominal abnormality on CT.  He may have just passed a stone.   will send home with ibuprofen 600 mg take with 1 g of Tylenol together 3 or 4 times a day, or he can take ibuprofen  600 mg with 1-2 Norco.  Advised him to not take the Tylenol and norco. will also send him home with an albuterol inhaler with an AeroChamber for his COPD.  Suspect that some of his chest tightness is from his COPD.  Doubt cardiac cause.  He will need to follow-up with a primary care physician of his choice for can continued blood glucose monitoring, will provide a primary care referral list.  He will need to be referred by a primary care physician to urology for the suspected UPJ stenosis.  He is to go to the ER if he gets worse in any way, chest tightness gets worse, fevers above 100.4, pain not controlled with the medications, or for any concerns.  Discussed labs, imaging, MDM, treatment plan, and plan for follow-up with patient. Discussed sn/sx that should prompt return to the ED. patient agrees with plan.   Meds ordered this encounter  Medications  . ketorolac (TORADOL) 30 MG/ML injection 30 mg  . acetaminophen (TYLENOL) tablet 1,000 mg  . ibuprofen (ADVIL,MOTRIN) 600 MG tablet    Sig: Take 1 tablet (600 mg total) by mouth every 6 (six) hours as needed.    Dispense:  30 tablet    Refill:  0  . albuterol (PROVENTIL HFA;VENTOLIN HFA) 108 (90 Base) MCG/ACT inhaler    Sig: Inhale 1-2 puffs into the lungs every 6 (six) hours as needed for wheezing or shortness of breath.    Dispense:  1 Inhaler    Refill:  0  . Spacer/Aero-Holding Chambers (AEROCHAMBER PLUS) inhaler    Sig: Use as instructed    Dispense:  1 each    Refill:  2  . HYDROcodone-acetaminophen (NORCO/VICODIN) 5-325 MG tablet    Sig: Take 1-2 tablets by mouth every 6 (six) hours as needed for moderate pain or severe pain.    Dispense:  12 tablet    Refill:  0    *This clinic note was created using Scientist, clinical (histocompatibility and immunogenetics)Dragon dictation software. Therefore, there may be occasional mistakes despite careful proofreading.  ?   Domenick GongMortenson, Neema Barreira, MD 12/22/17 1424

## 2017-12-22 NOTE — ED Triage Notes (Signed)
Patient c/o chest tightness that started early this morning. Patient also c/o back pain that started 1 week ago.

## 2017-12-22 NOTE — Discharge Instructions (Addendum)
ibuprofen 600 mg take with 1 g of Tylenol together 3 or 4 times a day, or you can take ibuprofen 600 mg with 1-2 Norco.  Do not take the Tylenol and norco.  Try 2 puffs from your albuterol inhaler with your spacer to see if that helps with your chest tightness.  I suspect that the symptoms are from COPD.  Continue trying to quit smoking.  Go immediately to the ER if you get worse in any way, chest tightness gets worse, fevers above 100.4, pain not controlled with the medications, or for any concerns.  Here is a list of primary care providers who are taking new patients:  Dr. Elizabeth Sauereanna Jones, Dr. Schuyler AmorWilliam Plonk [address] [place] [address] [phone]  [facility] [place] [address] [place] [address]  [place] [address]  [phone]  [facility] [address]  [place], [place] [address] [phone]  [facility] [place] [address]  [phone] [place], [place] [address]  Here are clinics/ other resources who will see you if you do not have insurance. Some have certain criteria that you must meet. Call them and find out what they are:  [facility]: [address] [place] [address] [place], [place] [address] Phone: [phone] Hours: First and Third Saturdays of each Month, 9 a.m. - 1 p.m.  [facility]: [address], [place], [place] [address] Phone: [phone] Hours: Tuesday, 4 p.m. - 8 p.m. Thursday, 1 p.m. - 8 p.m. Wednesday, 9 a.m. - [facility]  [facility] [address], [place], [place] [address] Phone: [phone] Pharmacy Phone Number: [phone] Dental Phone Number: [phone] [facility] Insurance Help: [phone]  Dental Hours: Monday - Thursday, 8 a.m. - 6 p.m.  [facility] [address] [place], [place] [address] Phone: [phone] Pharmacy Phone Number: [phone] [facility] Insurance Help: [phone]  [facility] [address] [place], [place]  [address] Phone: [phone] Pharmacy Phone Number: [phone] [facility] Insurance Help: [phone]  [facility] [address] [place], [place] [address] Phone: [phone] [facility] Insurance Help: [phone]   [facility] [address] [place], [place] [address] Phone: [phone]  Go to www.goodrx.com to look up your medications. This will give you a list of where you can find your prescriptions at the most affordable prices. Or ask the pharmacist what the cash price is, or if they have any other discount programs available to help make your medication more affordable. This can be less expensive than what you would pay with insurance.

## 2017-12-23 ENCOUNTER — Ambulatory Visit: Payer: Medicaid Other

## 2018-02-19 ENCOUNTER — Ambulatory Visit
Admission: EM | Admit: 2018-02-19 | Discharge: 2018-02-19 | Disposition: A | Discharge status: Home / Self Care | Payer: Medicaid Other | Attending: Family Medicine | Admitting: Family Medicine

## 2018-02-19 ENCOUNTER — Encounter: Payer: Self-pay | Admitting: Gynecology

## 2018-02-19 ENCOUNTER — Ambulatory Visit: Payer: Medicaid Other

## 2018-02-19 DIAGNOSIS — R1084 Generalized abdominal pain: Secondary | ICD-10-CM

## 2018-02-19 DIAGNOSIS — S8000XA Contusion of unspecified knee, initial encounter: Secondary | ICD-10-CM | POA: Diagnosis not present

## 2018-02-19 DIAGNOSIS — L089 Local infection of the skin and subcutaneous tissue, unspecified: Secondary | ICD-10-CM

## 2018-02-19 DIAGNOSIS — H409 Unspecified glaucoma: Secondary | ICD-10-CM | POA: Insufficient documentation

## 2018-02-19 DIAGNOSIS — S80211A Abrasion, right knee, initial encounter: Secondary | ICD-10-CM | POA: Diagnosis not present

## 2018-02-19 DIAGNOSIS — J449 Chronic obstructive pulmonary disease, unspecified: Secondary | ICD-10-CM | POA: Diagnosis not present

## 2018-02-19 DIAGNOSIS — Z79899 Other long term (current) drug therapy: Secondary | ICD-10-CM | POA: Insufficient documentation

## 2018-02-19 DIAGNOSIS — S80212A Abrasion, left knee, initial encounter: Secondary | ICD-10-CM | POA: Diagnosis not present

## 2018-02-19 DIAGNOSIS — Z791 Long term (current) use of non-steroidal anti-inflammatories (NSAID): Secondary | ICD-10-CM | POA: Insufficient documentation

## 2018-02-19 DIAGNOSIS — M25551 Pain in right hip: Secondary | ICD-10-CM | POA: Diagnosis not present

## 2018-02-19 DIAGNOSIS — M25559 Pain in unspecified hip: Secondary | ICD-10-CM | POA: Diagnosis not present

## 2018-02-19 DIAGNOSIS — T148XXA Other injury of unspecified body region, initial encounter: Secondary | ICD-10-CM

## 2018-02-19 DIAGNOSIS — Z880 Allergy status to penicillin: Secondary | ICD-10-CM | POA: Insufficient documentation

## 2018-02-19 DIAGNOSIS — F17200 Nicotine dependence, unspecified, uncomplicated: Secondary | ICD-10-CM | POA: Diagnosis not present

## 2018-02-19 DIAGNOSIS — R112 Nausea with vomiting, unspecified: Secondary | ICD-10-CM | POA: Insufficient documentation

## 2018-02-19 DIAGNOSIS — S8001XA Contusion of right knee, initial encounter: Secondary | ICD-10-CM | POA: Diagnosis not present

## 2018-02-19 DIAGNOSIS — S79921A Unspecified injury of right thigh, initial encounter: Secondary | ICD-10-CM | POA: Diagnosis not present

## 2018-02-19 DIAGNOSIS — S8002XA Contusion of left knee, initial encounter: Secondary | ICD-10-CM | POA: Diagnosis not present

## 2018-02-19 DIAGNOSIS — M25552 Pain in left hip: Secondary | ICD-10-CM

## 2018-02-19 HISTORY — DX: Unspecified asthma, uncomplicated: J45.909

## 2018-02-19 HISTORY — DX: Chronic obstructive pulmonary disease, unspecified: J44.9

## 2018-02-19 LAB — CBC WITH DIFFERENTIAL/PLATELET
BASOS PCT: 0 %
Basophils Absolute: 0.1 10*3/uL (ref 0–0.1)
EOS ABS: 0 10*3/uL (ref 0–0.7)
Eosinophils Relative: 0 %
HCT: 47.8 % (ref 40.0–52.0)
HEMOGLOBIN: 16.4 g/dL (ref 13.0–18.0)
Lymphocytes Relative: 10 %
Lymphs Abs: 1.8 10*3/uL (ref 1.0–3.6)
MCH: 30.3 pg (ref 26.0–34.0)
MCHC: 34.3 g/dL (ref 32.0–36.0)
MCV: 88.2 fL (ref 80.0–100.0)
Monocytes Absolute: 1.2 10*3/uL — ABNORMAL HIGH (ref 0.2–1.0)
Monocytes Relative: 7 %
NEUTROS PCT: 83 %
Neutro Abs: 13.8 10*3/uL — ABNORMAL HIGH (ref 1.4–6.5)
Platelets: 296 10*3/uL (ref 150–440)
RBC: 5.42 MIL/uL (ref 4.40–5.90)
RDW: 12.8 % (ref 11.5–14.5)
WBC: 16.9 10*3/uL — ABNORMAL HIGH (ref 3.8–10.6)

## 2018-02-19 LAB — COMPREHENSIVE METABOLIC PANEL
ALT: 34 U/L (ref 0–44)
ANION GAP: 11 (ref 5–15)
AST: 23 U/L (ref 15–41)
Albumin: 4.4 g/dL (ref 3.5–5.0)
Alkaline Phosphatase: 89 U/L (ref 38–126)
BUN: 16 mg/dL (ref 6–20)
CALCIUM: 8.8 mg/dL — AB (ref 8.9–10.3)
CO2: 26 mmol/L (ref 22–32)
CREATININE: 0.74 mg/dL (ref 0.61–1.24)
Chloride: 96 mmol/L — ABNORMAL LOW (ref 98–111)
GFR calc non Af Amer: >60 mL/min (ref >60)
Glucose, Bld: 207 mg/dL — ABNORMAL HIGH (ref 70–99)
POTASSIUM: 3.6 mmol/L (ref 3.5–5.1)
SODIUM: 133 mmol/L — AB (ref 135–145)
Total Bilirubin: 1.1 mg/dL (ref 0.3–1.2)
Total Protein: 7.9 g/dL (ref 6.5–8.1)

## 2018-02-19 MED ORDER — ONDANSETRON 8 MG PO TBDP
8.0000 mg | ORAL_TABLET | Freq: Once | ORAL | Status: AC
  Administered 2018-02-19: 8 mg via ORAL

## 2018-02-19 MED ORDER — PROMETHAZINE HCL 25 MG PO TABS: 25.0000 mg | ORAL_TABLET | Freq: Four times a day (QID) | ORAL | 0 refills | Status: DC | PRN

## 2018-02-19 MED ORDER — SILVER SULFADIAZINE 1 % EX CREA: 1.0000 "application " | TOPICAL_CREAM | Freq: Two times a day (BID) | CUTANEOUS | 0 refills | Status: DC

## 2018-02-19 MED ORDER — HYDROCODONE-ACETAMINOPHEN 5-325 MG PO TABS: ORAL_TABLET | ORAL | 0 refills | Status: DC

## 2018-02-19 MED ORDER — IOPAMIDOL (ISOVUE-300) INJECTION 61%
100.0000 mL | Freq: Once | INTRAVENOUS | Status: AC | PRN
  Administered 2018-02-19: 100 mL via INTRAVENOUS

## 2018-02-19 MED ORDER — KETOROLAC TROMETHAMINE 60 MG/2ML IM SOLN
60.0000 mg | Freq: Once | INTRAMUSCULAR | Status: AC
  Administered 2018-02-19: 60 mg via INTRAMUSCULAR

## 2018-02-19 MED ORDER — SULFAMETHOXAZOLE-TRIMETHOPRIM 800-160 MG PO TABS: 1.0000 | ORAL_TABLET | Freq: Two times a day (BID) | ORAL | 0 refills | Status: DC

## 2018-02-19 MED ORDER — PROMETHAZINE HCL 25 MG/ML IJ SOLN
25.0000 mg | Freq: Once | INTRAMUSCULAR | Status: AC
  Administered 2018-02-19: 25 mg via INTRAMUSCULAR
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
## 2018-02-22 DIAGNOSIS — T24222A Burn of second degree of left knee, initial encounter: Secondary | ICD-10-CM | POA: Diagnosis not present

## 2018-02-22 DIAGNOSIS — T310 Burns involving less than 10% of body surface: Secondary | ICD-10-CM | POA: Diagnosis not present

## 2018-02-22 DIAGNOSIS — T2112XA Burn of first degree of abdominal wall, initial encounter: Secondary | ICD-10-CM | POA: Diagnosis not present

## 2018-02-22 DIAGNOSIS — T24122A Burn of first degree of left knee, initial encounter: Secondary | ICD-10-CM | POA: Diagnosis not present

## 2018-02-22 DIAGNOSIS — T2122XA Burn of second degree of abdominal wall, initial encounter: Secondary | ICD-10-CM | POA: Diagnosis not present

## 2018-02-22 DIAGNOSIS — T24211A Burn of second degree of right thigh, initial encounter: Secondary | ICD-10-CM | POA: Diagnosis not present

## 2018-02-22 DIAGNOSIS — T24111A Burn of first degree of right thigh, initial encounter: Secondary | ICD-10-CM | POA: Diagnosis not present

## 2018-02-22 DIAGNOSIS — T24112A Burn of first degree of left thigh, initial encounter: Secondary | ICD-10-CM | POA: Diagnosis not present

## 2018-02-23 DIAGNOSIS — T310 Burns involving less than 10% of body surface: Secondary | ICD-10-CM | POA: Diagnosis not present

## 2018-02-23 DIAGNOSIS — T24222A Burn of second degree of left knee, initial encounter: Secondary | ICD-10-CM | POA: Diagnosis not present

## 2018-02-23 DIAGNOSIS — T24211A Burn of second degree of right thigh, initial encounter: Secondary | ICD-10-CM | POA: Diagnosis not present

## 2018-02-23 DIAGNOSIS — T2122XA Burn of second degree of abdominal wall, initial encounter: Secondary | ICD-10-CM | POA: Diagnosis not present

## 2018-02-25 DIAGNOSIS — T3110 Burns involving 10-19% of body surface with 0% to 9% third degree burns: Secondary | ICD-10-CM | POA: Diagnosis not present

## 2018-03-09 DIAGNOSIS — T2122XA Burn of second degree of abdominal wall, initial encounter: Secondary | ICD-10-CM | POA: Insufficient documentation

## 2018-03-09 DIAGNOSIS — T24211A Burn of second degree of right thigh, initial encounter: Secondary | ICD-10-CM | POA: Diagnosis not present

## 2018-03-09 DIAGNOSIS — T24222A Burn of second degree of left knee, initial encounter: Secondary | ICD-10-CM | POA: Diagnosis not present

## 2018-07-04 DIAGNOSIS — F418 Other specified anxiety disorders: Secondary | ICD-10-CM | POA: Insufficient documentation

## 2019-05-17 DIAGNOSIS — N135 Crossing vessel and stricture of ureter without hydronephrosis: Secondary | ICD-10-CM | POA: Diagnosis not present

## 2019-05-17 DIAGNOSIS — R739 Hyperglycemia, unspecified: Secondary | ICD-10-CM | POA: Diagnosis not present

## 2019-05-17 DIAGNOSIS — R1032 Left lower quadrant pain: Secondary | ICD-10-CM | POA: Diagnosis not present

## 2019-05-17 DIAGNOSIS — K219 Gastro-esophageal reflux disease without esophagitis: Secondary | ICD-10-CM | POA: Diagnosis not present

## 2019-05-17 DIAGNOSIS — N2889 Other specified disorders of kidney and ureter: Secondary | ICD-10-CM | POA: Diagnosis not present

## 2019-05-17 DIAGNOSIS — R0781 Pleurodynia: Secondary | ICD-10-CM | POA: Diagnosis not present

## 2019-05-17 DIAGNOSIS — R1012 Left upper quadrant pain: Secondary | ICD-10-CM | POA: Diagnosis not present

## 2019-05-18 DIAGNOSIS — E119 Type 2 diabetes mellitus without complications: Secondary | ICD-10-CM | POA: Diagnosis not present

## 2019-05-18 DIAGNOSIS — E1165 Type 2 diabetes mellitus with hyperglycemia: Secondary | ICD-10-CM | POA: Diagnosis not present

## 2019-05-18 DIAGNOSIS — Z131 Encounter for screening for diabetes mellitus: Secondary | ICD-10-CM | POA: Diagnosis not present

## 2019-05-25 DIAGNOSIS — R300 Dysuria: Secondary | ICD-10-CM | POA: Diagnosis not present

## 2019-05-25 DIAGNOSIS — R109 Unspecified abdominal pain: Secondary | ICD-10-CM | POA: Diagnosis not present

## 2019-05-25 DIAGNOSIS — S39011A Strain of muscle, fascia and tendon of abdomen, initial encounter: Secondary | ICD-10-CM | POA: Diagnosis not present

## 2019-05-25 DIAGNOSIS — R0781 Pleurodynia: Secondary | ICD-10-CM | POA: Diagnosis not present

## 2019-06-08 DIAGNOSIS — N135 Crossing vessel and stricture of ureter without hydronephrosis: Secondary | ICD-10-CM | POA: Insufficient documentation

## 2019-06-08 DIAGNOSIS — R739 Hyperglycemia, unspecified: Secondary | ICD-10-CM | POA: Diagnosis not present

## 2019-06-22 DIAGNOSIS — E1165 Type 2 diabetes mellitus with hyperglycemia: Secondary | ICD-10-CM | POA: Diagnosis not present

## 2019-06-22 DIAGNOSIS — K219 Gastro-esophageal reflux disease without esophagitis: Secondary | ICD-10-CM | POA: Diagnosis not present

## 2019-06-22 DIAGNOSIS — E119 Type 2 diabetes mellitus without complications: Secondary | ICD-10-CM | POA: Diagnosis not present

## 2019-07-11 DIAGNOSIS — Z20822 Contact with and (suspected) exposure to covid-19: Secondary | ICD-10-CM | POA: Diagnosis not present

## 2019-07-11 DIAGNOSIS — Z01812 Encounter for preprocedural laboratory examination: Secondary | ICD-10-CM | POA: Diagnosis not present

## 2019-07-13 DIAGNOSIS — E1159 Type 2 diabetes mellitus with other circulatory complications: Secondary | ICD-10-CM | POA: Insufficient documentation

## 2019-07-13 DIAGNOSIS — N135 Crossing vessel and stricture of ureter without hydronephrosis: Secondary | ICD-10-CM | POA: Diagnosis not present

## 2019-07-13 DIAGNOSIS — I10 Essential (primary) hypertension: Secondary | ICD-10-CM | POA: Insufficient documentation

## 2019-07-13 DIAGNOSIS — E119 Type 2 diabetes mellitus without complications: Secondary | ICD-10-CM | POA: Insufficient documentation

## 2019-07-13 DIAGNOSIS — Z4682 Encounter for fitting and adjustment of non-vascular catheter: Secondary | ICD-10-CM | POA: Diagnosis not present

## 2019-07-13 DIAGNOSIS — N130 Hydronephrosis with ureteropelvic junction obstruction: Secondary | ICD-10-CM | POA: Diagnosis not present

## 2019-08-03 DIAGNOSIS — N135 Crossing vessel and stricture of ureter without hydronephrosis: Secondary | ICD-10-CM | POA: Diagnosis not present

## 2019-09-08 DIAGNOSIS — M79672 Pain in left foot: Secondary | ICD-10-CM | POA: Diagnosis not present

## 2019-09-08 DIAGNOSIS — M19072 Primary osteoarthritis, left ankle and foot: Secondary | ICD-10-CM | POA: Diagnosis not present

## 2019-09-08 DIAGNOSIS — S99912A Unspecified injury of left ankle, initial encounter: Secondary | ICD-10-CM | POA: Diagnosis not present

## 2019-09-08 DIAGNOSIS — M25572 Pain in left ankle and joints of left foot: Secondary | ICD-10-CM | POA: Diagnosis not present

## 2019-11-02 DIAGNOSIS — N135 Crossing vessel and stricture of ureter without hydronephrosis: Secondary | ICD-10-CM | POA: Diagnosis not present

## 2019-11-16 DIAGNOSIS — N135 Crossing vessel and stricture of ureter without hydronephrosis: Secondary | ICD-10-CM | POA: Diagnosis not present
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
## 2019-12-02 DIAGNOSIS — E11628 Type 2 diabetes mellitus with other skin complications: Secondary | ICD-10-CM | POA: Diagnosis not present

## 2019-12-02 DIAGNOSIS — S91301A Unspecified open wound, right foot, initial encounter: Secondary | ICD-10-CM | POA: Diagnosis not present

## 2019-12-02 DIAGNOSIS — R110 Nausea: Secondary | ICD-10-CM | POA: Diagnosis not present

## 2019-12-02 DIAGNOSIS — R0789 Other chest pain: Secondary | ICD-10-CM | POA: Diagnosis not present

## 2019-12-02 DIAGNOSIS — L089 Local infection of the skin and subcutaneous tissue, unspecified: Secondary | ICD-10-CM | POA: Diagnosis not present

## 2019-12-04 DIAGNOSIS — L03115 Cellulitis of right lower limb: Secondary | ICD-10-CM | POA: Diagnosis not present

## 2019-12-04 DIAGNOSIS — E119 Type 2 diabetes mellitus without complications: Secondary | ICD-10-CM | POA: Diagnosis not present

## 2019-12-04 DIAGNOSIS — Z20822 Contact with and (suspected) exposure to covid-19: Secondary | ICD-10-CM | POA: Diagnosis not present

## 2019-12-04 DIAGNOSIS — L02611 Cutaneous abscess of right foot: Secondary | ICD-10-CM | POA: Diagnosis not present

## 2019-12-04 DIAGNOSIS — I10 Essential (primary) hypertension: Secondary | ICD-10-CM | POA: Diagnosis not present

## 2019-12-04 DIAGNOSIS — E11621 Type 2 diabetes mellitus with foot ulcer: Secondary | ICD-10-CM | POA: Diagnosis not present

## 2019-12-04 DIAGNOSIS — M79671 Pain in right foot: Secondary | ICD-10-CM | POA: Diagnosis not present

## 2019-12-05 DIAGNOSIS — I10 Essential (primary) hypertension: Secondary | ICD-10-CM | POA: Diagnosis not present

## 2019-12-05 DIAGNOSIS — L02611 Cutaneous abscess of right foot: Secondary | ICD-10-CM | POA: Diagnosis not present

## 2019-12-05 DIAGNOSIS — E119 Type 2 diabetes mellitus without complications: Secondary | ICD-10-CM | POA: Diagnosis not present

## 2020-01-04 DIAGNOSIS — E119 Type 2 diabetes mellitus without complications: Secondary | ICD-10-CM | POA: Diagnosis not present

## 2020-01-04 DIAGNOSIS — K219 Gastro-esophageal reflux disease without esophagitis: Secondary | ICD-10-CM | POA: Diagnosis not present

## 2020-01-04 DIAGNOSIS — E1165 Type 2 diabetes mellitus with hyperglycemia: Secondary | ICD-10-CM | POA: Diagnosis not present

## 2020-04-04 DIAGNOSIS — R0602 Shortness of breath: Secondary | ICD-10-CM | POA: Diagnosis not present

## 2020-04-04 DIAGNOSIS — R079 Chest pain, unspecified: Secondary | ICD-10-CM | POA: Diagnosis not present

## 2020-04-04 DIAGNOSIS — R0789 Other chest pain: Secondary | ICD-10-CM | POA: Diagnosis not present

## 2020-04-04 DIAGNOSIS — I25119 Atherosclerotic heart disease of native coronary artery with unspecified angina pectoris: Secondary | ICD-10-CM | POA: Diagnosis not present

## 2020-04-04 DIAGNOSIS — E1165 Type 2 diabetes mellitus with hyperglycemia: Secondary | ICD-10-CM | POA: Diagnosis not present

## 2020-04-18 DIAGNOSIS — Z23 Encounter for immunization: Secondary | ICD-10-CM | POA: Diagnosis not present

## 2020-05-21 DIAGNOSIS — Z23 Encounter for immunization: Secondary | ICD-10-CM | POA: Diagnosis not present

## 2020-06-26 DIAGNOSIS — Z794 Long term (current) use of insulin: Secondary | ICD-10-CM | POA: Diagnosis not present

## 2020-06-26 DIAGNOSIS — R079 Chest pain, unspecified: Secondary | ICD-10-CM | POA: Diagnosis not present

## 2020-06-26 DIAGNOSIS — R0609 Other forms of dyspnea: Secondary | ICD-10-CM | POA: Insufficient documentation

## 2020-06-26 DIAGNOSIS — Z8249 Family history of ischemic heart disease and other diseases of the circulatory system: Secondary | ICD-10-CM | POA: Insufficient documentation

## 2020-06-26 DIAGNOSIS — R0602 Shortness of breath: Secondary | ICD-10-CM | POA: Diagnosis not present

## 2020-06-26 DIAGNOSIS — R002 Palpitations: Secondary | ICD-10-CM | POA: Insufficient documentation

## 2020-06-26 DIAGNOSIS — I10 Essential (primary) hypertension: Secondary | ICD-10-CM | POA: Diagnosis not present

## 2020-06-26 DIAGNOSIS — E782 Mixed hyperlipidemia: Secondary | ICD-10-CM | POA: Insufficient documentation

## 2020-06-26 DIAGNOSIS — E785 Hyperlipidemia, unspecified: Secondary | ICD-10-CM | POA: Insufficient documentation

## 2020-06-26 DIAGNOSIS — I200 Unstable angina: Secondary | ICD-10-CM | POA: Insufficient documentation

## 2020-06-26 DIAGNOSIS — R55 Syncope and collapse: Secondary | ICD-10-CM | POA: Insufficient documentation

## 2020-06-26 DIAGNOSIS — E119 Type 2 diabetes mellitus without complications: Secondary | ICD-10-CM | POA: Diagnosis not present

## 2020-07-05 DIAGNOSIS — R079 Chest pain, unspecified: Secondary | ICD-10-CM | POA: Diagnosis not present

## 2020-07-12 DIAGNOSIS — F4312 Post-traumatic stress disorder, chronic: Secondary | ICD-10-CM | POA: Diagnosis not present

## 2020-07-12 DIAGNOSIS — F411 Generalized anxiety disorder: Secondary | ICD-10-CM | POA: Diagnosis not present

## 2020-07-12 DIAGNOSIS — Z79899 Other long term (current) drug therapy: Secondary | ICD-10-CM | POA: Diagnosis not present

## 2020-07-15 DIAGNOSIS — Z23 Encounter for immunization: Secondary | ICD-10-CM | POA: Diagnosis not present

## 2020-07-15 DIAGNOSIS — Z125 Encounter for screening for malignant neoplasm of prostate: Secondary | ICD-10-CM | POA: Diagnosis not present

## 2020-07-15 DIAGNOSIS — Z114 Encounter for screening for human immunodeficiency virus [HIV]: Secondary | ICD-10-CM | POA: Diagnosis not present

## 2020-07-15 DIAGNOSIS — L03313 Cellulitis of chest wall: Secondary | ICD-10-CM | POA: Diagnosis not present

## 2020-07-15 DIAGNOSIS — E119 Type 2 diabetes mellitus without complications: Secondary | ICD-10-CM | POA: Diagnosis not present

## 2020-07-15 DIAGNOSIS — Z720 Tobacco use: Secondary | ICD-10-CM | POA: Insufficient documentation

## 2020-10-04 DIAGNOSIS — F411 Generalized anxiety disorder: Secondary | ICD-10-CM | POA: Diagnosis not present

## 2020-10-04 DIAGNOSIS — F4312 Post-traumatic stress disorder, chronic: Secondary | ICD-10-CM | POA: Diagnosis not present

## 2020-10-04 DIAGNOSIS — Z79899 Other long term (current) drug therapy: Secondary | ICD-10-CM | POA: Diagnosis not present

## 2021-02-02 DIAGNOSIS — N1330 Unspecified hydronephrosis: Secondary | ICD-10-CM | POA: Diagnosis not present

## 2021-02-02 DIAGNOSIS — R109 Unspecified abdominal pain: Secondary | ICD-10-CM | POA: Diagnosis not present

## 2021-02-02 DIAGNOSIS — R3911 Hesitancy of micturition: Secondary | ICD-10-CM | POA: Diagnosis not present

## 2021-02-02 DIAGNOSIS — K529 Noninfective gastroenteritis and colitis, unspecified: Secondary | ICD-10-CM | POA: Diagnosis not present

## 2021-02-02 DIAGNOSIS — R350 Frequency of micturition: Secondary | ICD-10-CM | POA: Diagnosis not present

## 2021-02-07 ENCOUNTER — Emergency Department: Payer: Medicaid Other

## 2021-02-07 ENCOUNTER — Other Ambulatory Visit: Payer: Self-pay

## 2021-02-07 DIAGNOSIS — Z5321 Procedure and treatment not carried out due to patient leaving prior to being seen by health care provider: Secondary | ICD-10-CM | POA: Insufficient documentation

## 2021-02-07 DIAGNOSIS — R42 Dizziness and giddiness: Secondary | ICD-10-CM | POA: Diagnosis not present

## 2021-02-07 DIAGNOSIS — R109 Unspecified abdominal pain: Secondary | ICD-10-CM | POA: Insufficient documentation

## 2021-02-07 DIAGNOSIS — R0602 Shortness of breath: Secondary | ICD-10-CM | POA: Diagnosis not present

## 2021-02-07 LAB — COMPREHENSIVE METABOLIC PANEL
ALT: 18 U/L (ref 0–44)
AST: 21 U/L (ref 15–41)
Albumin: 4.2 g/dL (ref 3.5–5.0)
Alkaline Phosphatase: 102 U/L (ref 38–126)
Anion gap: 13 (ref 5–15)
BUN: 14 mg/dL (ref 6–20)
CO2: 25 mmol/L (ref 22–32)
Calcium: 9.5 mg/dL (ref 8.9–10.3)
Chloride: 94 mmol/L — ABNORMAL LOW (ref 98–111)
Creatinine, Ser: 0.9 mg/dL (ref 0.61–1.24)
GFR, Estimated: >60 mL/min (ref >60)
Glucose, Bld: 347 mg/dL — ABNORMAL HIGH (ref 70–99)
Potassium: 3.8 mmol/L (ref 3.5–5.1)
Sodium: 132 mmol/L — ABNORMAL LOW (ref 135–145)
Total Bilirubin: 0.8 mg/dL (ref 0.3–1.2)
Total Protein: 7.7 g/dL (ref 6.5–8.1)

## 2021-02-07 LAB — CBC
HCT: 50.9 % (ref 39.0–52.0)
Hemoglobin: 17.9 g/dL — ABNORMAL HIGH (ref 13.0–17.0)
MCH: 31.2 pg (ref 26.0–34.0)
MCHC: 35.2 g/dL (ref 30.0–36.0)
MCV: 88.7 fL (ref 80.0–100.0)
Platelets: 325 10*3/uL (ref 150–400)
RBC: 5.74 MIL/uL (ref 4.22–5.81)
RDW: 12.2 % (ref 11.5–15.5)
WBC: 11.2 10*3/uL — ABNORMAL HIGH (ref 4.0–10.5)
nRBC: 0 % (ref 0.0–0.2)

## 2021-02-07 LAB — URINALYSIS, COMPLETE (UACMP) WITH MICROSCOPIC
Bacteria, UA: NONE SEEN
Bilirubin Urine: NEGATIVE
Glucose, UA: >=500 mg/dL — AB
Hgb urine dipstick: NEGATIVE
Ketones, ur: NEGATIVE mg/dL
Leukocytes,Ua: NEGATIVE
Nitrite: NEGATIVE
Protein, ur: 30 mg/dL — AB
Specific Gravity, Urine: 1.033 — ABNORMAL HIGH (ref 1.005–1.030)
Squamous Epithelial / HPF: NONE SEEN (ref 0–5)
pH: 5 (ref 5.0–8.0)

## 2021-02-07 NOTE — ED Triage Notes (Signed)
Pt states had a ct scan on Sunday of abd. Pt states he is having pain around his navel and is having leaking from navel. Pt states he is also having dizziness and "a flutter" when he jumps up and down and intermittently shob.

## 2021-02-07 NOTE — ED Notes (Signed)
Patient transported to X-ray 

## 2021-02-08 ENCOUNTER — Emergency Department
Admission: EM | Admit: 2021-02-08 | Discharge: 2021-02-08 | Disposition: A | Discharge status: Home / Self Care | Payer: Medicaid Other | Attending: Emergency Medicine | Admitting: Emergency Medicine

## 2021-02-13 DIAGNOSIS — N135 Crossing vessel and stricture of ureter without hydronephrosis: Secondary | ICD-10-CM | POA: Diagnosis not present

## 2021-05-13 ENCOUNTER — Ambulatory Visit (INDEPENDENT_AMBULATORY_CARE_PROVIDER_SITE_OTHER): Payer: Medicaid Other | Admitting: Physician Assistant

## 2021-05-13 ENCOUNTER — Other Ambulatory Visit: Payer: Self-pay

## 2021-05-13 ENCOUNTER — Encounter: Payer: Self-pay | Admitting: Physician Assistant

## 2021-05-13 VITALS — BP 111/74 | HR 86 | Temp 97.7°F | Ht 66.0 in | Wt 195.0 lb

## 2021-05-13 DIAGNOSIS — E1169 Type 2 diabetes mellitus with other specified complication: Secondary | ICD-10-CM

## 2021-05-13 DIAGNOSIS — F319 Bipolar disorder, unspecified: Secondary | ICD-10-CM

## 2021-05-13 DIAGNOSIS — G8929 Other chronic pain: Secondary | ICD-10-CM | POA: Diagnosis not present

## 2021-05-13 DIAGNOSIS — E1159 Type 2 diabetes mellitus with other circulatory complications: Secondary | ICD-10-CM

## 2021-05-13 DIAGNOSIS — K219 Gastro-esophageal reflux disease without esophagitis: Secondary | ICD-10-CM | POA: Diagnosis not present

## 2021-05-13 DIAGNOSIS — I152 Hypertension secondary to endocrine disorders: Secondary | ICD-10-CM

## 2021-05-13 DIAGNOSIS — Z7689 Persons encountering health services in other specified circumstances: Secondary | ICD-10-CM

## 2021-05-13 DIAGNOSIS — E785 Hyperlipidemia, unspecified: Secondary | ICD-10-CM

## 2021-05-13 DIAGNOSIS — F419 Anxiety disorder, unspecified: Secondary | ICD-10-CM | POA: Diagnosis not present

## 2021-05-13 DIAGNOSIS — J449 Chronic obstructive pulmonary disease, unspecified: Secondary | ICD-10-CM

## 2021-05-13 DIAGNOSIS — M545 Low back pain, unspecified: Secondary | ICD-10-CM

## 2021-05-13 MED ORDER — VICTOZA 18 MG/3ML ~~LOC~~ SOPN: 1.8000 mg | PEN_INJECTOR | Freq: Every day | SUBCUTANEOUS | 2 refills | Status: DC

## 2021-05-13 NOTE — Progress Notes (Signed)
New Patient Office Visit  Subjective:  Patient ID: Zachary Downs, male    DOB: 06-15-1975  Age: 45 y.o. MRN: [id number]  CC:  Chief Complaint  Patient presents with   New Patient (Initial Visit)    HPI BIRL LOBELLO presents to establish care.  Patient has a past medical history of type 2 diabetes mellitus, hypertension, hyperlipidemia, GERD, COPD, asthma, anxiety, and bipolar disorder.  Patient relocated from [facility] to the area couple months ago.  Patient reports takes Jardiance 25 mg and Victoza 1.8 mg for diabetes, however has been without Victoza for 2 to 3 months. Patient does endorse drinking a lot of sodas, tea, energy drinks and coffee. Takes lisinopril 2.5 mg for blood pressure.  Patient is followed by psychiatry [facility] behavioral) and they manage Lamictal 200 mg twice daily, diazepam and clonidine.  Patient reports occasionally uses Spiriva and albuterol.  Patient has complaint of back pain which has been ongoing for several years and seems to be worsening the order he gets.  Reports a family history of Paget's disease.  States has not been evaluated or seen a specialist in the past for this issue.   Past Medical History:  Diagnosis Date   Asthma    COPD (chronic obstructive pulmonary disease) (HCC)    Glaucoma    H/O emphysema     Past Surgical History:  Procedure Laterality Date   KIDNEY SURGERY      History reviewed. No pertinent family history.  Social History   Socioeconomic History   Marital status: Married    Spouse name: Jamesyn Lindell   Number of children: Not on file   Years of education: Not on file   Highest education level: Not on file  Occupational History   Not on file  Tobacco Use   Smoking status: Every Day   Smokeless tobacco: Never  Vaping Use   Vaping Use: Not on file  Substance and Sexual Activity   Alcohol use: No   Drug use: Yes    Types: Marijuana    Comment: last used 08/06 am   Sexual activity: Yes  Other Topics  Concern   Not on file  Social History Narrative   Not on file   Social Determinants of Health   Financial Resource Strain: Not on file  Food Insecurity: Not on file  Transportation Needs: Not on file  Physical Activity: Not on file  Stress: Not on file  Social Connections: Not on file  Intimate Partner Violence: Not on file    ROS Review of Systems A fourteen system review of systems was performed and found to be positive as per HPI.  Objective:   Today's Vitals: BP 111/74    Pulse 86    Temp 97.7 F (36.5 C)    Ht 5' 6"  (1.676 m)    Wt 195 lb (88.5 kg)    SpO2 96%    BMI 31.47 kg/m   Physical Exam General:  Well Developed, well nourished, appropriate for stated age.  Neuro:  Alert and oriented,  extra-ocular muscles intact  HEENT:  Normocephalic, atraumatic, neck supple, no carotid bruits appreciated  Skin:  no gross rash, warm, pink. Cardiac:  RRR, S1 S2 Respiratory: CTA B/L MSK: Tenderness of low back, no step-off or deformity appreciated, good spinal range of motion Vascular:  Ext warm, no cyanosis apprec.; cap RF less 2 sec. Psych:  No HI/SI, judgement and insight good, Euthymic mood. Full Affect.  Assessment & Plan:   Problem  List Items Addressed This Visit   None Visit Diagnoses     Encounter to establish care    -  Primary   Type 2 diabetes mellitus with other specified complication, without long-term current use of insulin (HCC)       Relevant Medications   liraglutide (VICTOZA) 18 MG/3ML SOPN   glipiZIDE (GLUCOTROL) 10 MG tablet   rosuvastatin (CRESTOR) 10 MG tablet   lisinopril (ZESTRIL) 2.5 MG tablet   JARDIANCE 25 MG TABS tablet   Other Relevant Orders   HgB A1c (Completed)   CBC w/Diff (Completed)   Comp Met (CMET) (Completed)   Hypertension associated with diabetes (HCC)       Relevant Medications   liraglutide (VICTOZA) 18 MG/3ML SOPN   glipiZIDE (GLUCOTROL) 10 MG tablet   rosuvastatin (CRESTOR) 10 MG tablet   lisinopril (ZESTRIL) 2.5 MG  tablet   JARDIANCE 25 MG TABS tablet   Other Relevant Orders   CBC w/Diff (Completed)   Comp Met (CMET) (Completed)   Hyperlipidemia associated with type 2 diabetes mellitus (HCC)       Relevant Medications   liraglutide (VICTOZA) 18 MG/3ML SOPN   glipiZIDE (GLUCOTROL) 10 MG tablet   rosuvastatin (CRESTOR) 10 MG tablet   lisinopril (ZESTRIL) 2.5 MG tablet   JARDIANCE 25 MG TABS tablet   Anxiety       Relevant Medications   busPIRone (BUSPAR) 10 MG tablet   diazepam (VALIUM) 5 MG tablet   doxepin (SINEQUAN) 10 MG capsule   hydrOXYzine (VISTARIL) 25 MG capsule   Chronic low back pain, unspecified back pain laterality, unspecified whether sciatica present       Relevant Medications   celecoxib (CELEBREX) 200 MG capsule   doxepin (SINEQUAN) 10 MG capsule   lamoTRIgine (LAMICTAL) 100 MG tablet   Other Relevant Orders   Ambulatory referral to Orthopedic Surgery   Bipolar disorder with depression ([place])       Gastroesophageal reflux disease, unspecified whether esophagitis present       Relevant Medications   pantoprazole (PROTONIX) 40 MG tablet   Chronic obstructive pulmonary disease, unspecified COPD type ([place])       Relevant Medications   tiotropium (SPIRIVA) 18 MCG inhalation capsule      Encounter to establish care: -Reviewed records from [facility]. -Will request psychiatry records.   Type 2 diabetes mellitus with other specified complication, without long-term current use of insulin: -Uncontrolled, A1c from 07/15/2020 8.9. Will obtain A1c, pending lab results will make treatment adjustments if indicated.  Provided refill for Victoza 1.8 mg, continue Jardiance 25 mg daily. -Will continue to monitor.  Hypertension associated with diabetes: -Stable. -Continue lisinopril 2.5 mg daily. -Will continue to monitor.  Hyperlipidemia associated with type 2 diabetes mellitus: -Lipid panel 07/15/2020: Total cholesterol 215, HDL 51, triglycerides 432, LDL unable  to be calculated due to elevated triglycerides. -Continue rosuvastatin 10 mg.  Recommend repeating fasting lipid panel at follow-up visit. -Will continue to monitor.  Bipolar disorder with depression, Anxiety: -Followed by psychiatry. Will request records. -Continue current medication regimen.  Per patient not taking hydroxyzine or Buspar.  COPD: -Encouraged tobacco cessation. -Recommend to improve medication adherence with Spiriva.  Use albuterol as needed.  GERD: -Stable. -Continue pantoprazole 40 mg.  In the future will consider decreasing pantoprazole to 20 mg if symptoms remain stable. -Will continue to monitor.  Chronic low back pain, unspecified back pain laterality, unspecified whether sciatica present: -CT renal study from 12/22/2017 revealed spondylosis involving the visualized lower  thoracic spine, facet degenerative changes at L4-L5 and L5-S1. -Will place referral to orthopedics for further evaluation.   Of note, patient reports his wife manages his medications and not completely sure of all the medications he is taking. Recommend to verify and notify the office so medication list can be updated accordingly.   Outpatient Encounter Medications as of 05/13/2021  Medication Sig   Blood Glucose Monitoring Suppl (FIFTY50 GLUCOSE METER 2.0) w/Device KIT See admin instructions.   celecoxib (CELEBREX) 200 MG capsule TAKE 1 CAPSULE BY MOUTH TWICE DAILY FOR 14 DAYS   doxepin (SINEQUAN) 10 MG capsule TAKE ONE CAPSULE BY MOUTH EVERY NIGHT AT BEDTIME AND MAY increase TO TWO capsules AFTER THREE nights if no improvement   glipiZIDE (GLUCOTROL) 10 MG tablet Take by mouth.   hydrOXYzine (VISTARIL) 25 MG capsule TAKE ONE TO FOUR capsules BY MOUTH UP TO FOUR TO SIX hours as needed for anxiety   liraglutide (VICTOZA) 18 MG/3ML SOPN Inject 1.8 mg into the skin daily.   lisinopril (ZESTRIL) 2.5 MG tablet Take 1 tablet by mouth daily.   OLANZapine-FLUoxetine (SYMBYAX) 6-25 MG capsule     tamsulosin (FLOMAX) 0.4 MG CAPS capsule Take by mouth.   tiotropium (SPIRIVA) 18 MCG inhalation capsule Place into inhaler and inhale.   albuterol (PROVENTIL HFA;VENTOLIN HFA) 108 (90 Base) MCG/ACT inhaler Inhale 1-2 puffs into the lungs every 6 (six) hours as needed for wheezing or shortness of breath.   busPIRone (BUSPAR) 10 MG tablet Take by mouth.   cloNIDine HCl (KAPVAY) 0.1 MG TB12 ER tablet Take 0.2 mg by mouth 2 (two) times daily.   diazepam (VALIUM) 5 MG tablet Take 5 mg by mouth 3 (three) times daily.   HYDROcodone-acetaminophen (NORCO/VICODIN) 5-325 MG tablet 1-2 tabs po q 8 hours prn   ibuprofen (ADVIL,MOTRIN) 600 MG tablet Take 1 tablet (600 mg total) by mouth every 6 (six) hours as needed.   JARDIANCE 25 MG TABS tablet Take 25 mg by mouth every morning.   lamoTRIgine (LAMICTAL) 100 MG tablet Take 200 mg by mouth 2 (two) times daily.   pantoprazole (PROTONIX) 40 MG tablet Take 40 mg by mouth daily.   promethazine (PHENERGAN) 25 MG tablet Take 1 tablet (25 mg total) by mouth every 6 (six) hours as needed for nausea or vomiting.   rosuvastatin (CRESTOR) 10 MG tablet Take 10 mg by mouth daily.   silver sulfADIAZINE (SILVADENE) 1 % cream Apply 1 application topically 2 (two) times daily.   Spacer/Aero-Holding Chambers (AEROCHAMBER PLUS) inhaler Use as instructed   sulfamethoxazole-trimethoprim (BACTRIM DS,SEPTRA DS) 800-160 MG tablet Take 1 tablet by mouth 2 (two) times daily.   No facility-administered encounter medications on file as of 05/13/2021.    Follow-up: Return in about 3 months (around 08/11/2021) for DM, HTN, HLD.   Lorrene Reid, PA-C

## 2021-05-13 NOTE — Patient Instructions (Signed)

## 2021-05-14 ENCOUNTER — Other Ambulatory Visit: Payer: Self-pay

## 2021-05-14 DIAGNOSIS — E1169 Type 2 diabetes mellitus with other specified complication: Secondary | ICD-10-CM

## 2021-05-14 LAB — CBC WITH DIFFERENTIAL/PLATELET
Basophils Absolute: 0 10*3/uL (ref 0.0–0.2)
Basos: 0 %
EOS (ABSOLUTE): 0.2 10*3/uL (ref 0.0–0.4)
Eos: 2 %
Hematocrit: 50.6 % (ref 37.5–51.0)
Hemoglobin: 17.5 g/dL (ref 13.0–17.7)
Immature Grans (Abs): 0 10*3/uL (ref 0.0–0.1)
Immature Granulocytes: 0 %
Lymphocytes Absolute: 1.6 10*3/uL (ref 0.7–3.1)
Lymphs: 20 %
MCH: 31.1 pg (ref 26.6–33.0)
MCHC: 34.6 g/dL (ref 31.5–35.7)
MCV: 90 fL (ref 79–97)
Monocytes Absolute: 0.5 10*3/uL (ref 0.1–0.9)
Monocytes: 6 %
Neutrophils Absolute: 5.9 10*3/uL (ref 1.4–7.0)
Neutrophils: 72 %
Platelets: 270 10*3/uL (ref 150–450)
RBC: 5.63 x10E6/uL (ref 4.14–5.80)
RDW: 11.6 % (ref 11.6–15.4)
WBC: 8.2 10*3/uL (ref 3.4–10.8)

## 2021-05-14 LAB — COMPREHENSIVE METABOLIC PANEL
ALT: 20 IU/L (ref 0–44)
AST: 18 IU/L (ref 0–40)
Albumin/Globulin Ratio: 1.8 (ref 1.2–2.2)
Albumin: 4.4 g/dL (ref 4.0–5.0)
Alkaline Phosphatase: 164 IU/L — ABNORMAL HIGH (ref 44–121)
BUN/Creatinine Ratio: 10 (ref 9–20)
BUN: 8 mg/dL (ref 6–24)
Bilirubin Total: 0.3 mg/dL (ref 0.0–1.2)
CO2: 24 mmol/L (ref 20–29)
Calcium: 9.5 mg/dL (ref 8.7–10.2)
Chloride: 90 mmol/L — ABNORMAL LOW (ref 96–106)
Creatinine, Ser: 0.81 mg/dL (ref 0.76–1.27)
Globulin, Total: 2.4 g/dL (ref 1.5–4.5)
Glucose: 416 mg/dL — ABNORMAL HIGH (ref 70–99)
Potassium: 5 mmol/L (ref 3.5–5.2)
Sodium: 133 mmol/L — ABNORMAL LOW (ref 134–144)
Total Protein: 6.8 g/dL (ref 6.0–8.5)
eGFR: 111 mL/min/{1.73_m2} (ref >59)

## 2021-05-14 LAB — HEMOGLOBIN A1C
Est. average glucose Bld gHb Est-mCnc: 387 mg/dL
Hgb A1c MFr Bld: 15.1 % — ABNORMAL HIGH (ref 4.8–5.6)

## 2021-05-14 MED ORDER — LANTUS SOLOSTAR 100 UNIT/ML ~~LOC~~ SOPN: 10.0000 [IU] | PEN_INJECTOR | Freq: Every day | SUBCUTANEOUS | 0 refills | Status: DC

## 2021-05-30 ENCOUNTER — Ambulatory Visit: Payer: Medicaid Other | Admitting: Orthopaedic Surgery

## 2021-06-20 ENCOUNTER — Ambulatory Visit (INDEPENDENT_AMBULATORY_CARE_PROVIDER_SITE_OTHER): Payer: Medicaid Other | Admitting: Orthopaedic Surgery

## 2021-06-20 ENCOUNTER — Ambulatory Visit (INDEPENDENT_AMBULATORY_CARE_PROVIDER_SITE_OTHER): Payer: Medicaid Other

## 2021-06-20 ENCOUNTER — Other Ambulatory Visit: Payer: Self-pay

## 2021-06-20 VITALS — BP 107/76 | HR 84

## 2021-06-20 DIAGNOSIS — G8929 Other chronic pain: Secondary | ICD-10-CM

## 2021-06-20 DIAGNOSIS — M5442 Lumbago with sciatica, left side: Secondary | ICD-10-CM

## 2021-06-20 DIAGNOSIS — M549 Dorsalgia, unspecified: Secondary | ICD-10-CM

## 2021-06-20 DIAGNOSIS — M5441 Lumbago with sciatica, right side: Secondary | ICD-10-CM | POA: Diagnosis not present
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
## 2021-06-23 DIAGNOSIS — F411 Generalized anxiety disorder: Secondary | ICD-10-CM | POA: Diagnosis not present

## 2021-06-23 DIAGNOSIS — F4312 Post-traumatic stress disorder, chronic: Secondary | ICD-10-CM | POA: Diagnosis not present

## 2021-07-15 ENCOUNTER — Other Ambulatory Visit: Payer: Self-pay | Admitting: Physician Assistant

## 2021-07-15 DIAGNOSIS — E1169 Type 2 diabetes mellitus with other specified complication: Secondary | ICD-10-CM

## 2021-08-11 ENCOUNTER — Ambulatory Visit: Payer: Medicaid Other | Admitting: Physician Assistant

## 2021-08-11 NOTE — Progress Notes (Deleted)
?Established patient visit ? ? ?Patient: Zachary Downs   DOB: Nov 20, 1975   46 y.o. Male  MRN: [id number] ?Visit Date: 08/11/2021 ? ?No chief complaint on file. ? ?Subjective  ?  ?HPI  ?*** ?Diabetes: Pt denies increased urination or thirst. Pt reports medication compliance. No hypoglycemic events. Checking glucose at home. FBS range from*** ? ?HTN: Pt denies chest pain, palpitations, dizziness or leg swelling. Taking medication as directed without side effects. Checks BP at home ***times/wk and readings range in ***. Pt follows a low salt diet. ? ?HLD: Pt taking medication as directed without issues. Denies side effects including myalgias and RUQ pain.  ? ? ?Medications: ?Outpatient Medications Prior to Visit  ?Medication Sig  ? albuterol (PROVENTIL HFA;VENTOLIN HFA) 108 (90 Base) MCG/ACT inhaler Inhale 1-2 puffs into the lungs every 6 (six) hours as needed for wheezing or shortness of breath.  ? Blood Glucose Monitoring Suppl (FIFTY50 GLUCOSE METER 2.0) w/Device KIT See admin instructions.  ? busPIRone (BUSPAR) 10 MG tablet Take by mouth.  ? celecoxib (CELEBREX) 200 MG capsule TAKE 1 CAPSULE BY MOUTH TWICE DAILY FOR 14 DAYS  ? cloNIDine HCl (KAPVAY) 0.1 MG TB12 ER tablet Take 0.2 mg by mouth 2 (two) times daily.  ? diazepam (VALIUM) 5 MG tablet Take 5 mg by mouth 3 (three) times daily.  ? doxepin (SINEQUAN) 10 MG capsule TAKE ONE CAPSULE BY MOUTH EVERY NIGHT AT BEDTIME AND MAY increase TO TWO capsules AFTER THREE nights if no improvement  ? glipiZIDE (GLUCOTROL) 10 MG tablet Take by mouth.  ? HYDROcodone-acetaminophen (NORCO/VICODIN) 5-325 MG tablet 1-2 tabs po q 8 hours prn  ? hydrOXYzine (VISTARIL) 25 MG capsule TAKE ONE TO FOUR capsules BY MOUTH UP TO FOUR TO SIX hours as needed for anxiety  ? ibuprofen (ADVIL,MOTRIN) 600 MG tablet Take 1 tablet (600 mg total) by mouth every 6 (six) hours as needed.  ? JARDIANCE 25 MG TABS tablet Take 25 mg by mouth every morning.  ? lamoTRIgine (LAMICTAL) 100 MG tablet Take 200  mg by mouth 2 (two) times daily.  ? LANTUS SOLOSTAR 100 UNIT/ML Solostar Pen INJECT 10 UNITS SUBCUTANEOUSLY AT BEDTIME  ? liraglutide (VICTOZA) 18 MG/3ML SOPN Inject 1.8 mg into the skin daily.  ? lisinopril (ZESTRIL) 2.5 MG tablet Take 1 tablet by mouth daily.  ? OLANZapine-FLUoxetine (SYMBYAX) 6-25 MG capsule   ? pantoprazole (PROTONIX) 40 MG tablet Take 40 mg by mouth daily.  ? promethazine (PHENERGAN) 25 MG tablet Take 1 tablet (25 mg total) by mouth every 6 (six) hours as needed for nausea or vomiting.  ? rosuvastatin (CRESTOR) 10 MG tablet Take 10 mg by mouth daily.  ? silver sulfADIAZINE (SILVADENE) 1 % cream Apply 1 application topically 2 (two) times daily.  ? Spacer/Aero-Holding Chambers (AEROCHAMBER PLUS) inhaler Use as instructed  ? sulfamethoxazole-trimethoprim (BACTRIM DS,SEPTRA DS) 800-160 MG tablet Take 1 tablet by mouth 2 (two) times daily.  ? tamsulosin (FLOMAX) 0.4 MG CAPS capsule Take by mouth.  ? tiotropium (SPIRIVA) 18 MCG inhalation capsule Place into inhaler and inhale.  ? ?No facility-administered medications prior to visit.  ? ? ?Review of Systems ? ?{Labs  Heme  Chem  Endocrine  Serology  Results Review (optional):23779} ?  Objective  ?  ?There were no vitals taken for this visit. ?BP Readings from Last 3 Encounters:  ?06/20/21 107/76  ?05/13/21 111/74  ?02/07/21 (!) 124/100  ? ?Wt Readings from Last 3 Encounters:  ?05/13/21 195 lb (88.5 kg)  ?02/07/21 200 lb (  90.7 kg)  ?02/19/18 210 lb (95.3 kg)  ? ? ?Physical Exam  ?*** ? ?No results found for any visits on 08/11/21. ? Assessment & Plan  ?  ? ?*** ?Problem List Items Addressed This Visit   ?None ? ? ?No follow-ups on file.  ?   ? ? ? ?Lorrene Reid, PA-C  ?[facility] ?[phone] (phone) ?[phone] (fax) ? ?[facility] ?

## 2021-09-01 ENCOUNTER — Other Ambulatory Visit (INDEPENDENT_AMBULATORY_CARE_PROVIDER_SITE_OTHER): Payer: Medicaid Other

## 2021-09-01 DIAGNOSIS — R300 Dysuria: Secondary | ICD-10-CM

## 2021-09-01 LAB — POCT URINALYSIS DIPSTICK
Bilirubin, UA: NEGATIVE
Blood, UA: NEGATIVE
Glucose, UA: POSITIVE — AB
Ketones, UA: NEGATIVE
Leukocytes, UA: NEGATIVE
Nitrite, UA: NEGATIVE
Protein, UA: POSITIVE — AB
Spec Grav, UA: 1.025 (ref 1.010–1.025)
Urobilinogen, UA: 0.2 E.U./dL
pH, UA: 6 (ref 5.0–8.0)

## 2021-09-08 ENCOUNTER — Encounter: Payer: Self-pay | Admitting: Physician Assistant

## 2021-09-08 ENCOUNTER — Ambulatory Visit (INDEPENDENT_AMBULATORY_CARE_PROVIDER_SITE_OTHER): Payer: Medicaid Other | Admitting: Physician Assistant

## 2021-09-08 VITALS — BP 109/77 | HR 93 | Temp 97.8°F | Ht 65.0 in | Wt 187.0 lb

## 2021-09-08 DIAGNOSIS — E1159 Type 2 diabetes mellitus with other circulatory complications: Secondary | ICD-10-CM

## 2021-09-08 DIAGNOSIS — R6881 Early satiety: Secondary | ICD-10-CM | POA: Diagnosis not present

## 2021-09-08 DIAGNOSIS — E1169 Type 2 diabetes mellitus with other specified complication: Secondary | ICD-10-CM | POA: Diagnosis not present

## 2021-09-08 DIAGNOSIS — R1084 Generalized abdominal pain: Secondary | ICD-10-CM | POA: Diagnosis not present

## 2021-09-08 DIAGNOSIS — E785 Hyperlipidemia, unspecified: Secondary | ICD-10-CM | POA: Diagnosis not present

## 2021-09-08 DIAGNOSIS — I152 Hypertension secondary to endocrine disorders: Secondary | ICD-10-CM | POA: Diagnosis not present

## 2021-09-08 DIAGNOSIS — Z87898 Personal history of other specified conditions: Secondary | ICD-10-CM | POA: Diagnosis not present

## 2021-09-08 DIAGNOSIS — F319 Bipolar disorder, unspecified: Secondary | ICD-10-CM

## 2021-09-08 LAB — POCT GLYCOSYLATED HEMOGLOBIN (HGB A1C): Hemoglobin A1C: 8 % — AB (ref 4.0–5.6)

## 2021-09-08 NOTE — Assessment & Plan Note (Signed)
-  Will obtain a fasting lipid panel at next chronic f/up visit. Recommend to continue rosuvastatin 10 mg daily. Will continue to monitor. ?

## 2021-09-08 NOTE — Progress Notes (Signed)
?Established patient visit ? ? ?Patient: Zachary Downs   DOB: 10-Apr-1976   46 y.o. Male  MRN: [id number] ?Visit Date: 09/08/2021 ? ?No chief complaint on file. ? ?Subjective  ?  ?HPI  ?Patient presents for chronic f/up. Patient has complaints of decreased appetite and unintentional weight loss. Reports feels nauseated a lot, no vomiting. No change in bowel habits, belching, flatulence, or fever. Does reports night sweats. Does have abdominal pain which radiates to his back. Reports decreased appetite seem to start after starting Jardiance which has been about a year ago. Has been on Victoza for about 2 years. Also reports an episode of blacking out and his wife noticed some "flopping". Has a history of seizure in adolescence. ? ?Diabetes: Pt denies increased urination or thirst. Pt reports medication compliance with Lantus and Victoza. No hypoglycemic events. Checking glucose at home. FBS range from 170.  ? ?HTN: Pt denies chest pain, palpitations, shortness of breath or lower extremity swelling. States feeling dizzy when getting up from a laying position. Taking medication as directed without side effects. Tries to stay hydrated.  ? ?HLD: Pt taking medication as directed without issues.  ? ?Mood: Continues to see [place] [facility]. ? ?Medications: ?Outpatient Medications Prior to Visit  ?Medication Sig  ? albuterol (PROVENTIL HFA;VENTOLIN HFA) 108 (90 Base) MCG/ACT inhaler Inhale 1-2 puffs into the lungs every 6 (six) hours as needed for wheezing or shortness of breath.  ? Blood Glucose Monitoring Suppl (FIFTY50 GLUCOSE METER 2.0) w/Device KIT See admin instructions.  ? busPIRone (BUSPAR) 10 MG tablet Take by mouth.  ? celecoxib (CELEBREX) 200 MG capsule TAKE 1 CAPSULE BY MOUTH TWICE DAILY FOR 14 DAYS  ? cloNIDine HCl (KAPVAY) 0.1 MG TB12 ER tablet Take 0.2 mg by mouth 2 (two) times daily.  ? diazepam (VALIUM) 5 MG tablet Take 5 mg by mouth 3 (three) times daily.  ? doxepin (SINEQUAN) 10 MG capsule TAKE ONE  CAPSULE BY MOUTH EVERY NIGHT AT BEDTIME AND MAY increase TO TWO capsules AFTER THREE nights if no improvement  ? glipiZIDE (GLUCOTROL) 10 MG tablet Take by mouth.  ? hydrOXYzine (VISTARIL) 25 MG capsule TAKE ONE TO FOUR capsules BY MOUTH UP TO FOUR TO SIX hours as needed for anxiety  ? ibuprofen (ADVIL,MOTRIN) 600 MG tablet Take 1 tablet (600 mg total) by mouth every 6 (six) hours as needed.  ? lamoTRIgine (LAMICTAL) 100 MG tablet Take 200 mg by mouth 2 (two) times daily.  ? LANTUS SOLOSTAR 100 UNIT/ML Solostar Pen INJECT 10 UNITS SUBCUTANEOUSLY AT BEDTIME  ? liraglutide (VICTOZA) 18 MG/3ML SOPN Inject 1.8 mg into the skin daily.  ? lisinopril (ZESTRIL) 2.5 MG tablet Take 1 tablet by mouth daily.  ? OLANZapine-FLUoxetine (SYMBYAX) 6-25 MG capsule   ? pantoprazole (PROTONIX) 40 MG tablet Take 40 mg by mouth daily.  ? promethazine (PHENERGAN) 25 MG tablet Take 1 tablet (25 mg total) by mouth every 6 (six) hours as needed for nausea or vomiting.  ? rosuvastatin (CRESTOR) 10 MG tablet Take 10 mg by mouth daily.  ? silver sulfADIAZINE (SILVADENE) 1 % cream Apply 1 application topically 2 (two) times daily.  ? Spacer/Aero-Holding Chambers (AEROCHAMBER PLUS) inhaler Use as instructed  ? sulfamethoxazole-trimethoprim (BACTRIM DS,SEPTRA DS) 800-160 MG tablet Take 1 tablet by mouth 2 (two) times daily.  ? tamsulosin (FLOMAX) 0.4 MG CAPS capsule Take by mouth.  ? tiotropium (SPIRIVA) 18 MCG inhalation capsule Place into inhaler and inhale.  ? [DISCONTINUED] JARDIANCE 25 MG TABS tablet Take 25  mg by mouth every morning.  ? [DISCONTINUED] HYDROcodone-acetaminophen (NORCO/VICODIN) 5-325 MG tablet 1-2 tabs po q 8 hours prn (Patient not taking: Reported on 09/08/2021)  ? ?No facility-administered medications prior to visit.  ? ? ?Review of Systems ?Review of Systems:  ?A fourteen system review of systems was performed and found to be positive as per HPI. ? ? ?  Objective  ?  ?BP 109/77   Pulse 93   Temp 97.8 ?F (36.6 ?C)   Ht 5'  5" (1.651 m)   Wt 187 lb (84.8 kg)   SpO2 94%   BMI 31.12 kg/m?  ?BP Readings from Last 3 Encounters:  ?09/08/21 109/77  ?06/20/21 107/76  ?05/13/21 111/74  ? ?Wt Readings from Last 3 Encounters:  ?09/08/21 187 lb (84.8 kg)  ?05/13/21 195 lb (88.5 kg)  ?02/07/21 200 lb (90.7 kg)  ? ? ?Physical Exam  ?General:  non-diaphoretic, in no acute distress, appears stated age ?Neuro:  Alert and oriented,  extra-ocular muscles intact  ?HEENT:  Normocephalic, atraumatic, neck supple  ?Skin:  no gross rash, warm, pink. ?Abdomen: generalized tenderness, +BS, no distension, no CVA tenderness, no guarding  ?Cardiac:  RRR, S1 S2 ?Respiratory: CTA B/L  ?Vascular:  Ext warm, no cyanosis apprec.; cap RF less 2 sec. ?Psych:  No HI/SI, judgement and insight good, Euthymic mood. Full Affect. ? ? ?Results for orders placed or performed in visit on 09/08/21  ?POCT glycosylated hemoglobin (Hb A1C)  ?Result Value Ref Range  ? Hemoglobin A1C 8.0 (A) 4.0 - 5.6 %  ? HbA1c POC (<> result, manual entry)    ? HbA1c, POC (prediabetic range)    ? HbA1c, POC (controlled diabetic range)    ? ? Assessment & Plan  ?  ? ? ?Problem List Items Addressed This Visit   ? ?  ? Cardiovascular and Mediastinum  ? Hypertension associated with diabetes ([place])  ?  -BP stable in office. Continue Lisinopril 2.5 mg which is also providing renal protection with exisiting T2DM. Will continue to monitor. Will collect CMP to monitor renal function and electrolytes. ? ?  ?  ? Relevant Orders  ? CBC w/Diff  ? Comp Met (CMET)  ? Lipase  ?  ? Endocrine  ? Type 2 diabetes mellitus ([place]) - Primary  ?  -A1c has improved from 15.1 to 8.0. Will discontinue Jardiance due to GI complaints to evaluate for medication effects. Also discussed potential side effects with Victoza including pancreatitis so will collect lipase. Pending lab results will make treatment adjustments indicated. Will continue with Lantus 10 units at bedtime and Victoza 1.8 mg daily. Will reassess medication  therapy in 4 weeks. ? ?  ?  ? Relevant Orders  ? POCT glycosylated hemoglobin (Hb A1C) (Completed)  ? Hyperlipidemia associated with type 2 diabetes mellitus ([place])  ?  -Will obtain a fasting lipid panel at next chronic f/up visit. Recommend to continue rosuvastatin 10 mg daily. Will continue to monitor. ? ?  ?  ?  ? Other  ? Bipolar disorder with depression ([place])  ?  -Followed by psychiatry. ?-Will request records from [place].  ? ? ?  ?  ? ?Other Visit Diagnoses   ? ? History of seizure      ? Relevant Orders  ? Ambulatory referral to Neurology  ? Generalized abdominal pain      ? Relevant Orders  ? Ambulatory referral to Gastroenterology  ? CBC w/Diff  ? Comp Met (CMET)  ? Lipase  ?  Early satiety      ? Relevant Orders  ? Ambulatory referral to Gastroenterology  ? ?  ? ?History of seizure: ?-Patient reports an episode that has s/sx of possible seizure. Will place referral to neurology. Pt requesting [facility] near [place]. Patient is on Lamictal 200 mg BID which is managed by psychiatry.  ? ?Generalized abdominal pain, early satiety: ?-Discussed with patient potential etiologies. Will discontinue Jardiance, possible medication side effect since symptom onset was around when he started medication. Patient is also on GLP-1 therapy with Victoza so will collect lipase to evaluate for pancreatitis. Will place referral to gastroenterology for further evaluation. Recommend to continue with pantoprazole 40 mg.  ? ?Return in about 4 weeks (around 10/06/2021) for DM- stopped med, dec apptetite.  ?   ? ? ? ?Lorrene Reid, PA-C  ?[facility] ?[phone] (phone) ?[phone] (fax) ? ?[facility] ?

## 2021-09-08 NOTE — Assessment & Plan Note (Signed)
-  Followed by psychiatry. ?-Will request records from [place].  ? ?

## 2021-09-08 NOTE — Patient Instructions (Signed)
Diabetic Retinopathy  Diabetic retinopathy is a disease of the retina. The retina is a light-sensitive membrane at the back of the eye. Retinopathy is a complication of diabetes and a common cause of bad eyesight. It can eventually cause blindness. Early detection and treatment of diabetic retinopathy is important in keeping your eyes healthy and preventing further damage to them. What are the causes? Diabetic retinopathy is caused by blood sugar (glucose) levels that are too high for a long period of time. Blood glucose levels that are too high for a long time can: Damage small blood vessels in the retina, allowing blood to leak through the vessel walls. Cause new, abnormal blood vessels to grow on the retina. This can scar the retina in the advanced stage of diabetic retinopathy. What increases the risk? You are more likely to develop this condition if: You have had diabetes for a long time. You have poorly controlled blood glucose. You have high blood pressure. What are the signs or symptoms? In the early stages of diabetic retinopathy, there are often no symptoms. As the condition gets worse, symptoms may include: Blurred vision. This is usually caused by swelling due to abnormal blood glucose levels. The blurriness may go away when blood glucose levels return to normal. Moving specks or dark spots (floaters) in your vision. These can be caused by a small amount of bleeding (hemorrhage) from retinal blood vessels. Missing parts of your field of vision, such as vision at the sides of the eyes. This can be caused by larger retinal hemorrhages. Difficulty reading. Double vision. Pain in one or both eyes. Feeling pressure in one or both eyes. Trouble seeing straight lines. Straight lines may not look straight. Redness of the eyes that does not go away. How is this diagnosed? This condition may be diagnosed with an eye exam. For this exam, your eye specialist puts drops in your eyes that  enlarge your pupils. The retina is then checked for changes in its blood vessels. How is this treated? This condition may be treated by: Keeping your blood glucose and blood pressure within a target range. Using a type of laser beam to seal your retinal blood vessels. This stops them from bleeding and decreases pressure in your eye. Getting shots of medicine in the eye to reduce swelling of the center of the retina (macula). You may be given: Anti-VEGF medicine. This medicine can help slow vision loss, and may even improve vision. Steroid medicine. Follow these instructions at home: Follow your diabetes management plan as directed by your health care provider. This may include exercising regularly and eating a healthy diet. Keep your blood glucose level and your blood pressure in your target range. Your health care provider will tell you what your target is. Check your blood glucose as often as directed. Take over the counter and prescription medicines only as told by your health care provider. This includes insulin and oral diabetes medicine. Get your eyes checked at least once every year. An eye specialist can usually see diabetic retinopathy developing long before it starts to cause problems. In many cases, it can be treated to prevent problems from starting in the first place. Do not use any products that contain nicotine or tobacco, such as cigarettes, e-cigarettes, and chewing tobacco. If you need help quitting, ask your health care provider. Keep all follow-up visits. This is important. Contact a health care provider if: You notice gradual blurring or other changes in your vision over time. You notice that your   glasses or contact lenses do not make things look as sharp as they once did. You have trouble reading or seeing details at a distance with either eye. You notice a change in your vision or notice that parts of your field of vision appear missing or hazy. You suddenly see moving  specks or dark spots in the field of vision of either eye. Get help right away if: You have sudden pain or pressure in one or both eyes. You suddenly lose vision or a curtain or veil seems to come across your eyes. You have a sudden burst of floaters in your vision. Summary Diabetic retinopathy is a disease of the retina. The retina is a light-sensitive membrane at the back of the eye. Retinopathy is a complication of diabetes. Get your eyes checked at least once every year. An eye specialist can usually see diabetic retinopathy developing long before it starts to cause problems. In many cases, it can be treated to prevent problems from starting in the first place. Keep your blood glucose and your blood pressure in your target range. Follow your diabetes management plan as directed by your health care provider. Get help right away if you have sudden pain or sudden pressure in one or both eyes. Also, get help if you have a sudden burst of floaters in your vision. This information is not intended to replace advice given to you by your health care provider. Make sure you discuss any questions you have with your health care provider. Document Revised: 09/14/2019 Document Reviewed: 09/14/2019 Elsevier Patient Education  2023 Elsevier Inc.  

## 2021-09-08 NOTE — Assessment & Plan Note (Signed)
-  A1c has improved from 15.1 to 8.0. Will discontinue Jardiance due to GI complaints to evaluate for medication effects. Also discussed potential side effects with Victoza including pancreatitis so will collect lipase. Pending lab results will make treatment adjustments indicated. Will continue with Lantus 10 units at bedtime and Victoza 1.8 mg daily. Will reassess medication therapy in 4 weeks. ?

## 2021-09-08 NOTE — Assessment & Plan Note (Signed)
-  BP stable in office. Continue Lisinopril 2.5 mg which is also providing renal protection with exisiting T2DM. Will continue to monitor. Will collect CMP to monitor renal function and electrolytes. ?

## 2021-09-09 LAB — CBC WITH DIFFERENTIAL/PLATELET
Basophils Absolute: 0 10*3/uL (ref 0.0–0.2)
Basos: 1 %
EOS (ABSOLUTE): 0.2 10*3/uL (ref 0.0–0.4)
Eos: 2 %
Hematocrit: 51.3 % — ABNORMAL HIGH (ref 37.5–51.0)
Hemoglobin: 17.8 g/dL — ABNORMAL HIGH (ref 13.0–17.7)
Immature Grans (Abs): 0 10*3/uL (ref 0.0–0.1)
Immature Granulocytes: 0 %
Lymphocytes Absolute: 1.9 10*3/uL (ref 0.7–3.1)
Lymphs: 22 %
MCH: 30.4 pg (ref 26.6–33.0)
MCHC: 34.7 g/dL (ref 31.5–35.7)
MCV: 88 fL (ref 79–97)
Monocytes Absolute: 0.5 10*3/uL (ref 0.1–0.9)
Monocytes: 6 %
Neutrophils Absolute: 6 10*3/uL (ref 1.4–7.0)
Neutrophils: 69 %
Platelets: 322 10*3/uL (ref 150–450)
RBC: 5.86 x10E6/uL — ABNORMAL HIGH (ref 4.14–5.80)
RDW: 11.9 % (ref 11.6–15.4)
WBC: 8.6 10*3/uL (ref 3.4–10.8)

## 2021-09-09 LAB — COMPREHENSIVE METABOLIC PANEL
ALT: 24 IU/L (ref 0–44)
AST: 16 IU/L (ref 0–40)
Albumin/Globulin Ratio: 1.9 (ref 1.2–2.2)
Albumin: 4.6 g/dL (ref 4.0–5.0)
Alkaline Phosphatase: 110 IU/L (ref 44–121)
BUN/Creatinine Ratio: 14 (ref 9–20)
BUN: 10 mg/dL (ref 6–24)
Bilirubin Total: 0.3 mg/dL (ref 0.0–1.2)
CO2: 22 mmol/L (ref 20–29)
Calcium: 9.7 mg/dL (ref 8.7–10.2)
Chloride: 102 mmol/L (ref 96–106)
Creatinine, Ser: 0.74 mg/dL — ABNORMAL LOW (ref 0.76–1.27)
Globulin, Total: 2.4 g/dL (ref 1.5–4.5)
Glucose: 172 mg/dL — ABNORMAL HIGH (ref 70–99)
Potassium: 4.2 mmol/L (ref 3.5–5.2)
Sodium: 139 mmol/L (ref 134–144)
Total Protein: 7 g/dL (ref 6.0–8.5)
eGFR: 115 mL/min/{1.73_m2} (ref >59)

## 2021-09-09 LAB — LIPASE: Lipase: 37 U/L (ref 13–78)

## 2021-10-06 DIAGNOSIS — Z79899 Other long term (current) drug therapy: Secondary | ICD-10-CM | POA: Diagnosis not present

## 2021-10-06 DIAGNOSIS — F411 Generalized anxiety disorder: Secondary | ICD-10-CM | POA: Diagnosis not present

## 2021-11-12 ENCOUNTER — Other Ambulatory Visit: Payer: Self-pay | Admitting: Physician Assistant

## 2021-11-12 DIAGNOSIS — E1169 Type 2 diabetes mellitus with other specified complication: Secondary | ICD-10-CM

## 2021-11-24 DIAGNOSIS — F411 Generalized anxiety disorder: Secondary | ICD-10-CM | POA: Diagnosis not present

## 2021-11-24 DIAGNOSIS — F4312 Post-traumatic stress disorder, chronic: Secondary | ICD-10-CM | POA: Diagnosis not present

## 2022-01-30 ENCOUNTER — Other Ambulatory Visit: Payer: Self-pay | Admitting: Physician Assistant

## 2022-01-30 DIAGNOSIS — E1169 Type 2 diabetes mellitus with other specified complication: Secondary | ICD-10-CM

## 2022-02-23 DIAGNOSIS — F411 Generalized anxiety disorder: Secondary | ICD-10-CM | POA: Diagnosis not present

## 2022-02-23 DIAGNOSIS — F4312 Post-traumatic stress disorder, chronic: Secondary | ICD-10-CM | POA: Diagnosis not present

## 2022-03-19 DIAGNOSIS — N135 Crossing vessel and stricture of ureter without hydronephrosis: Secondary | ICD-10-CM | POA: Diagnosis not present

## 2022-06-08 DIAGNOSIS — F3181 Bipolar II disorder: Secondary | ICD-10-CM | POA: Diagnosis not present

## 2022-06-12 ENCOUNTER — Other Ambulatory Visit: Payer: Self-pay | Admitting: Nurse Practitioner

## 2022-06-12 DIAGNOSIS — Z0000 Encounter for general adult medical examination without abnormal findings: Secondary | ICD-10-CM

## 2022-06-12 DIAGNOSIS — E1169 Type 2 diabetes mellitus with other specified complication: Secondary | ICD-10-CM

## 2022-06-12 DIAGNOSIS — E785 Hyperlipidemia, unspecified: Secondary | ICD-10-CM

## 2022-06-12 DIAGNOSIS — E1159 Type 2 diabetes mellitus with other circulatory complications: Secondary | ICD-10-CM

## 2022-06-18 ENCOUNTER — Telehealth: Payer: Self-pay

## 2022-06-18 NOTE — Telephone Encounter (Signed)
Contacted Pt alternative person to try to contact pt due to the number on file for him a spanish speaking person answer.   The referral was for [place] Number [phone]

## 2022-07-14 ENCOUNTER — Other Ambulatory Visit: Payer: Self-pay

## 2022-07-14 ENCOUNTER — Other Ambulatory Visit: Payer: Medicaid Other

## 2022-07-14 DIAGNOSIS — Z0000 Encounter for general adult medical examination without abnormal findings: Secondary | ICD-10-CM | POA: Diagnosis not present

## 2022-07-14 DIAGNOSIS — I152 Hypertension secondary to endocrine disorders: Secondary | ICD-10-CM

## 2022-07-14 DIAGNOSIS — E1159 Type 2 diabetes mellitus with other circulatory complications: Secondary | ICD-10-CM | POA: Diagnosis not present

## 2022-07-14 DIAGNOSIS — E1169 Type 2 diabetes mellitus with other specified complication: Secondary | ICD-10-CM

## 2022-07-14 DIAGNOSIS — E785 Hyperlipidemia, unspecified: Secondary | ICD-10-CM | POA: Diagnosis not present

## 2022-07-15 LAB — CBC WITH DIFFERENTIAL/PLATELET
Basophils Absolute: 0.1 10*3/uL (ref 0.0–0.2)
Basos: 1 %
EOS (ABSOLUTE): 0.2 10*3/uL (ref 0.0–0.4)
Eos: 2 %
Hematocrit: 53.4 % — ABNORMAL HIGH (ref 37.5–51.0)
Hemoglobin: 17.6 g/dL (ref 13.0–17.7)
Immature Grans (Abs): 0 10*3/uL (ref 0.0–0.1)
Immature Granulocytes: 0 %
Lymphocytes Absolute: 1.7 10*3/uL (ref 0.7–3.1)
Lymphs: 19 %
MCH: 29.9 pg (ref 26.6–33.0)
MCHC: 33 g/dL (ref 31.5–35.7)
MCV: 91 fL (ref 79–97)
Monocytes Absolute: 0.6 10*3/uL (ref 0.1–0.9)
Monocytes: 6 %
Neutrophils Absolute: 6.3 10*3/uL (ref 1.4–7.0)
Neutrophils: 72 %
Platelets: 313 10*3/uL (ref 150–450)
RBC: 5.88 x10E6/uL — ABNORMAL HIGH (ref 4.14–5.80)
RDW: 11.7 % (ref 11.6–15.4)
WBC: 8.8 10*3/uL (ref 3.4–10.8)

## 2022-07-15 LAB — COMPREHENSIVE METABOLIC PANEL
ALT: 18 IU/L (ref 0–44)
AST: 18 IU/L (ref 0–40)
Albumin/Globulin Ratio: 2 (ref 1.2–2.2)
Albumin: 4.5 g/dL (ref 4.1–5.1)
Alkaline Phosphatase: 129 IU/L — ABNORMAL HIGH (ref 44–121)
BUN/Creatinine Ratio: 16 (ref 9–20)
BUN: 12 mg/dL (ref 6–24)
Bilirubin Total: 0.3 mg/dL (ref 0.0–1.2)
CO2: 22 mmol/L (ref 20–29)
Calcium: 9.5 mg/dL (ref 8.7–10.2)
Chloride: 94 mmol/L — ABNORMAL LOW (ref 96–106)
Creatinine, Ser: 0.75 mg/dL — ABNORMAL LOW (ref 0.76–1.27)
Globulin, Total: 2.3 g/dL (ref 1.5–4.5)
Glucose: 364 mg/dL — ABNORMAL HIGH (ref 70–99)
Potassium: 3.8 mmol/L (ref 3.5–5.2)
Sodium: 134 mmol/L (ref 134–144)
Total Protein: 6.8 g/dL (ref 6.0–8.5)
eGFR: 113 mL/min/{1.73_m2} (ref >59)

## 2022-07-15 LAB — HEMOGLOBIN A1C
Est. average glucose Bld gHb Est-mCnc: 378 mg/dL
Hgb A1c MFr Bld: 14.8 % — ABNORMAL HIGH (ref 4.8–5.6)

## 2022-07-15 LAB — LIPID PANEL
Chol/HDL Ratio: 4.9 ratio (ref 0.0–5.0)
Cholesterol, Total: 270 mg/dL — ABNORMAL HIGH (ref 100–199)
HDL: 55 mg/dL (ref >39)
LDL Chol Calc (NIH): 179 mg/dL — ABNORMAL HIGH (ref 0–99)
Triglycerides: 191 mg/dL — ABNORMAL HIGH (ref 0–149)
VLDL Cholesterol Cal: 36 mg/dL (ref 5–40)

## 2022-07-15 LAB — TSH: TSH: 0.415 u[IU]/mL — ABNORMAL LOW (ref 0.450–4.500)
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
## 2022-07-21 ENCOUNTER — Telehealth: Payer: Self-pay | Admitting: *Deleted

## 2022-07-21 ENCOUNTER — Encounter: Payer: Self-pay | Admitting: Family Medicine

## 2022-07-21 ENCOUNTER — Ambulatory Visit (INDEPENDENT_AMBULATORY_CARE_PROVIDER_SITE_OTHER): Payer: Medicaid Other | Admitting: Family Medicine

## 2022-07-21 VITALS — BP 110/70 | HR 107 | Ht 65.0 in | Wt 199.0 lb

## 2022-07-21 DIAGNOSIS — R234 Changes in skin texture: Secondary | ICD-10-CM | POA: Diagnosis not present

## 2022-07-21 DIAGNOSIS — Z1211 Encounter for screening for malignant neoplasm of colon: Secondary | ICD-10-CM

## 2022-07-21 DIAGNOSIS — M79662 Pain in left lower leg: Secondary | ICD-10-CM | POA: Diagnosis not present

## 2022-07-21 DIAGNOSIS — E119 Type 2 diabetes mellitus without complications: Secondary | ICD-10-CM

## 2022-07-21 DIAGNOSIS — R0609 Other forms of dyspnea: Secondary | ICD-10-CM

## 2022-07-21 DIAGNOSIS — R233 Spontaneous ecchymoses: Secondary | ICD-10-CM

## 2022-07-21 DIAGNOSIS — R7989 Other specified abnormal findings of blood chemistry: Secondary | ICD-10-CM

## 2022-07-21 DIAGNOSIS — E785 Hyperlipidemia, unspecified: Secondary | ICD-10-CM

## 2022-07-21 DIAGNOSIS — R5383 Other fatigue: Secondary | ICD-10-CM | POA: Diagnosis not present

## 2022-07-21 DIAGNOSIS — K219 Gastro-esophageal reflux disease without esophagitis: Secondary | ICD-10-CM

## 2022-07-21 DIAGNOSIS — R110 Nausea: Secondary | ICD-10-CM

## 2022-07-21 DIAGNOSIS — E1159 Type 2 diabetes mellitus with other circulatory complications: Secondary | ICD-10-CM

## 2022-07-21 DIAGNOSIS — I152 Hypertension secondary to endocrine disorders: Secondary | ICD-10-CM

## 2022-07-21 DIAGNOSIS — E1169 Type 2 diabetes mellitus with other specified complication: Secondary | ICD-10-CM

## 2022-07-21 LAB — POCT UA - MICROALBUMIN
Albumin/Creatinine Ratio, Urine, POC: >300
Creatinine, POC: 10 mg/dL
Microalbumin Ur, POC: 150 mg/L

## 2022-07-21 MED ORDER — SEMAGLUTIDE 3 MG PO TABS: 3.0000 mg | ORAL_TABLET | Freq: Every day | ORAL | 1 refills | Status: DC

## 2022-07-21 MED ORDER — ROSUVASTATIN CALCIUM 10 MG PO TABS: 10.0000 mg | ORAL_TABLET | Freq: Every day | ORAL | 0 refills | Status: DC

## 2022-07-21 MED ORDER — ONDANSETRON HCL 4 MG PO TABS: 4.0000 mg | ORAL_TABLET | Freq: Three times a day (TID) | ORAL | 0 refills | Status: DC | PRN

## 2022-07-21 MED ORDER — ALBUTEROL SULFATE HFA 108 (90 BASE) MCG/ACT IN AERS: 1.0000 | INHALATION_SPRAY | Freq: Four times a day (QID) | RESPIRATORY_TRACT | 0 refills | Status: DC | PRN

## 2022-07-21 MED ORDER — SILVER SULFADIAZINE 1 % EX CREA: 1.0000 | TOPICAL_CREAM | Freq: Two times a day (BID) | CUTANEOUS | 0 refills | Status: DC

## 2022-07-21 MED ORDER — PANTOPRAZOLE SODIUM 40 MG PO TBEC: 40.0000 mg | DELAYED_RELEASE_TABLET | Freq: Every day | ORAL | 1 refills | Status: DC

## 2022-07-21 NOTE — Patient Instructions (Addendum)
Please go to have your leg evaluated at [facility] to rule out compartment syndrome. You can find their information here or give them a call: [email or web address]  [phone]  If your pain does get even worse then the Emergency Department is the best place to go. I hope that it does not get worse and starts to improve, but I want you to be aware of the next steps just in case.   I hope the new medication for your blood sugar works better for you!  We can increase the dose after 1 month if you are tolerating it okay.  We will check in at your next appointment and see how things are going and if we need to change anything else.  Please let me know in the meantime if you need anything else, otherwise I will see you then!

## 2022-07-21 NOTE — Telephone Encounter (Signed)
Pt wife said Rxs that were sent today need to be sent to [facility]. Cedar Ditullio Zimmerman Rumple, CMA
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
## 2022-07-21 NOTE — Assessment & Plan Note (Addendum)
A1c increased dramatically from 8.0 to14.8.  Patient discontinued all diabetes medications.  Jardiance and metformin caused GI symptoms and weight loss that the patient found intolerable.  Patient does not want to do any more injections so decided to stop Lantus and Victoza on his own.  Provided education on the importance of maintaining good control of blood sugars to prevent long-term complications of uncontrolled diabetes including retinopathy, nephropathy, possible limb amputation.  We discussed other possible medication options as well as a referral to endocrinology.  The patient has agreed to try 1 more medication with primary care before meeting with endocrinology.  He has agreed to a trial of semaglutide tablets.  We will start him on the lowest dose and titrate up, rechecking A1c in 3 months.  At that point will consider titrating up to the maximum dose of Rybelsus, we may likely need to add a second medication and/or insulin since he was at that point already.

## 2022-07-21 NOTE — Assessment & Plan Note (Signed)
Last lipid panel: LDL 179, HDL 55, triglycerides 191.  He has not been taking his rosuvastatin and was under the impression that he had to take it at bedtime.  I encouraged him to start taking it again and assured him that he can take it in the morning with all of his other pills if that simplifies his routine for him patient expressed understanding and agrees to restart this medication.

## 2022-07-21 NOTE — Assessment & Plan Note (Signed)
BP at goal in office.  Continue lisinopril 2.5 mg, also protecting renal function with type 2 diabetes.  Will continue to monitor.

## 2022-07-22 ENCOUNTER — Encounter: Payer: Self-pay | Admitting: Gastroenterology

## 2022-07-22 LAB — SPECIMEN STATUS

## 2022-07-22 LAB — TSH: TSH: 0.569 u[IU]/mL (ref 0.450–4.500)

## 2022-07-22 LAB — T3, FREE: T3, Free: 2.7 pg/mL (ref 2.0–4.4)

## 2022-07-22 LAB — PROTIME-INR
INR: 1 (ref 0.9–1.2)
Prothrombin Time: 10.7 s (ref 9.1–12.0)

## 2022-07-22 LAB — T4, FREE: Free T4: 1.58 ng/dL (ref 0.82–1.77)

## 2022-07-22 LAB — APTT: aPTT: 31 s (ref 24–33)

## 2022-07-22 MED ORDER — PANTOPRAZOLE SODIUM 40 MG PO TBEC: 40.0000 mg | DELAYED_RELEASE_TABLET | Freq: Every day | ORAL | 1 refills | Status: AC

## 2022-07-22 MED ORDER — SEMAGLUTIDE 3 MG PO TABS: 3.0000 mg | ORAL_TABLET | Freq: Every day | ORAL | 2 refills | Status: AC

## 2022-07-22 MED ORDER — ROSUVASTATIN CALCIUM 10 MG PO TABS: 10.0000 mg | ORAL_TABLET | Freq: Every day | ORAL | 0 refills | Status: DC

## 2022-07-22 MED ORDER — ONDANSETRON HCL 4 MG PO TABS: 4.0000 mg | ORAL_TABLET | Freq: Three times a day (TID) | ORAL | 0 refills | Status: DC | PRN

## 2022-07-22 MED ORDER — ALBUTEROL SULFATE HFA 108 (90 BASE) MCG/ACT IN AERS: 1.0000 | INHALATION_SPRAY | Freq: Four times a day (QID) | RESPIRATORY_TRACT | 0 refills | Status: AC | PRN

## 2022-07-22 NOTE — Addendum Note (Signed)
Addended by: Virgil Benedict on: 07/22/2022 10:01 AM   Modules accepted: Orders

## 2022-07-23 ENCOUNTER — Telehealth: Payer: Self-pay

## 2022-07-23 LAB — PATHOLOGIST SMEAR REVIEW
Basophils Absolute: 0.1 10*3/uL (ref 0.0–0.2)
Basos: 1 %
EOS (ABSOLUTE): 0.1 10*3/uL (ref 0.0–0.4)
Eos: 1 %
Hematocrit: 50.7 % (ref 37.5–51.0)
Hemoglobin: 16.9 g/dL (ref 13.0–17.7)
Immature Grans (Abs): 0 10*3/uL (ref 0.0–0.1)
Immature Granulocytes: 0 %
Lymphocytes Absolute: 1.9 10*3/uL (ref 0.7–3.1)
Lymphs: 16 %
MCH: 30.3 pg (ref 26.6–33.0)
MCHC: 33.3 g/dL (ref 31.5–35.7)
MCV: 91 fL (ref 79–97)
Monocytes Absolute: 0.7 10*3/uL (ref 0.1–0.9)
Monocytes: 6 %
Neutrophils Absolute: 8.7 10*3/uL — ABNORMAL HIGH (ref 1.4–7.0)
Neutrophils: 76 %
Platelets: 319 10*3/uL (ref 150–450)
RBC: 5.58 x10E6/uL (ref 4.14–5.80)
RDW: 11.7 % (ref 11.6–15.4)
WBC: 11.5 10*3/uL — ABNORMAL HIGH (ref 3.4–10.8)

## 2022-07-23 NOTE — Telephone Encounter (Signed)
Medicaid has denied Rybelsus. The preferred formularies are;  Byetta, Ozempic and Trulicity.

## 2022-07-23 NOTE — Telephone Encounter (Signed)
Appeal request with office notes have been faxed to [facility].

## 2022-07-23 NOTE — Telephone Encounter (Signed)
Could you close this encounter, I do not have clearance to sign it.

## 2022-07-26 DIAGNOSIS — M79605 Pain in left leg: Secondary | ICD-10-CM | POA: Diagnosis not present

## 2022-08-31 DIAGNOSIS — F4310 Post-traumatic stress disorder, unspecified: Secondary | ICD-10-CM | POA: Diagnosis not present

## 2022-08-31 DIAGNOSIS — F411 Generalized anxiety disorder: Secondary | ICD-10-CM | POA: Diagnosis not present

## 2022-09-01 ENCOUNTER — Ambulatory Visit: Payer: Medicaid Other | Admitting: Gastroenterology

## 2022-09-15 ENCOUNTER — Ambulatory Visit: Payer: Medicaid Other | Admitting: Gastroenterology

## 2022-10-21 ENCOUNTER — Ambulatory Visit: Payer: Medicaid Other | Admitting: Family Medicine

## 2022-10-23 ENCOUNTER — Encounter: Payer: Self-pay | Admitting: Family Medicine

## 2022-10-23 ENCOUNTER — Ambulatory Visit (INDEPENDENT_AMBULATORY_CARE_PROVIDER_SITE_OTHER): Payer: Medicaid Other | Admitting: Family Medicine

## 2022-10-23 VITALS — BP 112/76 | HR 94 | Resp 20 | Ht 65.0 in | Wt 172.0 lb

## 2022-10-23 DIAGNOSIS — I152 Hypertension secondary to endocrine disorders: Secondary | ICD-10-CM

## 2022-10-23 DIAGNOSIS — E1169 Type 2 diabetes mellitus with other specified complication: Secondary | ICD-10-CM | POA: Diagnosis not present

## 2022-10-23 DIAGNOSIS — R110 Nausea: Secondary | ICD-10-CM

## 2022-10-23 DIAGNOSIS — E785 Hyperlipidemia, unspecified: Secondary | ICD-10-CM

## 2022-10-23 DIAGNOSIS — E1165 Type 2 diabetes mellitus with hyperglycemia: Secondary | ICD-10-CM | POA: Diagnosis not present

## 2022-10-23 DIAGNOSIS — E1159 Type 2 diabetes mellitus with other circulatory complications: Secondary | ICD-10-CM | POA: Diagnosis not present

## 2022-10-23 MED ORDER — ONDANSETRON HCL 4 MG PO TABS
4.0000 mg | ORAL_TABLET | Freq: Three times a day (TID) | ORAL | 0 refills | Status: AC | PRN
Start: 2022-10-23 — End: ?

## 2022-10-23 MED ORDER — LISINOPRIL 2.5 MG PO TABS
2.5000 mg | ORAL_TABLET | Freq: Every day | ORAL | 1 refills | Status: AC
Start: 2022-10-23 — End: ?

## 2022-10-23 MED ORDER — ROSUVASTATIN CALCIUM 10 MG PO TABS
10.0000 mg | ORAL_TABLET | Freq: Every day | ORAL | 0 refills | Status: AC
Start: 2022-10-23 — End: ?

## 2022-10-23 MED ORDER — OZEMPIC (0.25 OR 0.5 MG/DOSE) 2 MG/3ML ~~LOC~~ SOPN
0.2500 mg | PEN_INJECTOR | SUBCUTANEOUS | 1 refills | Status: DC
Start: 2022-10-23 — End: 2023-01-21

## 2022-10-23 NOTE — Assessment & Plan Note (Signed)
Last lipid panel: LDL 179, HDL 55, triglycerides 191.  Not fasting today, will return for fasting lab work.  Patient has been good about taking rosuvastatin every day, so we are hopeful that lab numbers will reflect that.  Will continue to monitor.

## 2022-10-23 NOTE — Progress Notes (Signed)
o  Established Patient Office Visit  Subjective   Patient ID: Zachary Downs, male    DOB: 03-29-1976  Age: 47 y.o. MRN: [id number]  Chief Complaint  Patient presents with   Diabetes   Hypertension    HPI Zachary Downs is a 47 y.o. male presenting today for follow up of hypertension, hyperlipidemia, diabetes. Hypertension:  Pt denies chest pain, SOB, dizziness, edema, syncope, fatigue or heart palpitations. Taking lisinopril, reports excellent compliance with treatment. Denies side effects. Hyperlipidemia: tolerating rosuvastatin well with no myalgias or significant side effects.  The 10-year ASCVD risk score (Arnett DK, et al., 2019) is: 11.9% Diabetes: denies hypoglycemic events, wounds or sores that are not healing well, increased thirst or urination. Denies vision problems, eye exam due. Checking glucose at home, ranges have been as high as the 500s but most often have been between 200 and 260.  Insurance would not approve Rybelsus and would not pay for any other medications, so he has not had his diabetes medicine for about 2 months.  He endorses significant rapid weight loss after starting Jardiance 25 mg tablets in October 2022. He has continued to experience weight loss and fatigue since then even when going without diabetes medication.  More recently, he also endorses cold intolerance.  ROS Negative unless otherwise noted in HPI   Objective:     BP 112/76 (BP Location: Left Arm, Patient Position: Sitting, Cuff Size: Normal)   Pulse 94   Resp 20   Ht 5\' 5"  (1.651 m)   Wt 172 lb (78 kg)   SpO2 95%   BMI 28.62 kg/m   Physical Exam Constitutional:      General: He is not in acute distress.    Appearance: Normal appearance.  HENT:     Head: Normocephalic and atraumatic.  Cardiovascular:     Rate and Rhythm: Normal rate and regular rhythm.     Pulses: Normal pulses.     Heart sounds: No murmur heard.    No friction rub. No gallop.  Pulmonary:     Effort: Pulmonary  effort is normal. No respiratory distress.     Breath sounds: Normal breath sounds. No wheezing, rhonchi or rales.  Skin:    General: Skin is warm and dry.  Neurological:     Mental Status: He is alert and oriented to person, place, and time.  Psychiatric:        Mood and Affect: Mood normal.    Diabetic Foot Form - Detailed   Diabetic Foot Exam - detailed  10/23/2022 11:38 AM  Visual Foot Exam completed.: Yes  Can the patient see the bottom of their feet?: Yes Are the shoes appropriate in style and fit?: Yes Is there swelling or and abnormal foot shape?: No Is there a claw toe deformity?: No Is there elevated skin temparature?: No Is there foot or ankle muscle weakness?: Yes Normal Range of Motion: No Pulse Foot Exam completed.: Yes   Right posterior Tibialias: Present Left posterior Tibialias: Present   Right Dorsalis Pedis: Present Left Dorsalis Pedis: Present  Sensory Foot Exam Completed.: Yes Semmes-Weinstein Monofilament Test R Site 1-Great Toe: Pos L Site 1-Great Toe: Pos          Assessment & Plan:  Hypertension associated with diabetes (HCC) Assessment & Plan: BP goal <130/80.  Stable, at goal.  Continue lisinopril 2.5 mg daily.  Will continue to monitor.  Orders: -     CBC with Differential/Platelet; Future -     Comprehensive  metabolic panel; Future -     Lisinopril; Take 1 tablet (2.5 mg total) by mouth daily.  Dispense: 90 tablet; Refill: 1  Hyperlipidemia associated with type 2 diabetes mellitus (HCC) Assessment & Plan: Last lipid panel: LDL 179, HDL 55, triglycerides 191.  Not fasting today, will return for fasting lab work.  Patient has been good about taking rosuvastatin every day, so we are hopeful that lab numbers will reflect that.  Will continue to monitor.  Orders: -     Lipid panel; Future -     Rosuvastatin Calcium; Take 1 tablet (10 mg total) by mouth daily. Can take in the morning or night.  Dispense: 90 tablet; Refill: 0  Type 2 diabetes  mellitus with hyperglycemia, without long-term current use of insulin (HCC) Assessment & Plan: Patient has been without medication for the last couple of months, most recent A1c 14.8.  Previously, Jardiance and metformin caused GI symptoms that were intolerable.  Lantus and Victoza were also intolerable, patient would like medication routine to be as simple as possible.  Insurance would not cover Rybelsus for him, but he is open to trying something like Ozempic or Mounjaro.  Sending Ozempic starting dose [facility]25 mg [facility] to [facility], follow-up in 1 month to assess efficacy and tolerance.  Recommended maintaining adequate hydration and having small frequent meals to minimize risk of GI side effects.  We also discussed that he may have better luck getting medications approved and getting A1c under control by seeing specialists at endocrinology.  Patient agrees to referral to endocrinology, which has been placed today.  He will continue to follow-up with them for routine diabetes care until regimen is well-established and A1c is under control.  At that time, we can discuss if he would like to return to PCP.  We will likely need to use the maximum dose of Ozempic.  It is also possible that we will need to add on a second medication.  Additionally, he would benefit from extensive nutritional education as his current eating habits are counterproductive to maintaining blood glucose control.  Orders: -     Hemoglobin A1c; Future -     Ozempic (0.25 or 0.5 MG/DOSE); Inject 0.25 mg into the skin once a week.  Dispense: 3 mL; Refill: 1 -     Ambulatory referral to Endocrinology  Nausea -     Ondansetron HCl; Take 1 tablet (4 mg total) by mouth every 8 (eight) hours as needed for nausea or vomiting.  Dispense: 20 tablet; Refill: 0  I suspect that weight loss, cold intolerance, polydipsia/polyuria are secondary to chronic hyperglycemia.  Encouraged the patient to discuss these symptoms with the endocrinologist in case there  may be anything else going on.  Thyroid function tests have been within normal limits, presentation does not make me suspicious for adrenal insufficiency at this time.  Return in about 1 week (around 10/30/2022) for fasting blood work, 1 month follow up for DM after starting Ozempic.   I spent 45 minutes on the day of the encounter to include pre-visit record review; face-to-face time with the patient providing education, collecting history, conducting physical exam; and post visit ordering of labs, medications, referrals.  Melida Quitter, PA

## 2022-10-23 NOTE — Assessment & Plan Note (Signed)
BP goal <130/80.  Stable, at goal.  Continue lisinopril 2.5 mg daily.  Will continue to monitor.

## 2022-10-23 NOTE — Assessment & Plan Note (Signed)
Patient has been without medication for the last couple of months, most recent A1c 14.8.  Previously, Jardiance and metformin caused GI symptoms that were intolerable.  Lantus and Victoza were also intolerable, patient would like medication routine to be as simple as possible.  Insurance would not cover Rybelsus for him, but he is open to trying something like Ozempic or Mounjaro.  S[facility]em[facility] starting dose [facility], follow-up in 1 month to assess efficacy and tolerance.  Recommended maintaining adequate hydration and having small frequent meals to minimize risk of GI side effects.  We also discussed that he may have better luck getting medications approved and getting A1c under control by seeing specialists at endocrinology.  Patient agrees to referral to endocrinology, which has been placed today.  He will continue to follow-up with them for routine diabetes care until regimen is well-established and A1c is under control.  At that time, we can discuss if he would like to return to PCP.  We will likely need to use the maximum dose of Ozempic.  It is also possible that we will need to add on a second medication.  Additionally, he would benefit from extensive nutritional education as his current eating habits are counterproductive to maintaining blood glucose control.

## 2022-10-27 ENCOUNTER — Other Ambulatory Visit: Payer: Medicaid Other

## 2022-10-27 DIAGNOSIS — E1159 Type 2 diabetes mellitus with other circulatory complications: Secondary | ICD-10-CM | POA: Diagnosis not present

## 2022-10-27 DIAGNOSIS — E785 Hyperlipidemia, unspecified: Secondary | ICD-10-CM | POA: Diagnosis not present

## 2022-10-27 DIAGNOSIS — E1165 Type 2 diabetes mellitus with hyperglycemia: Secondary | ICD-10-CM

## 2022-10-27 DIAGNOSIS — I152 Hypertension secondary to endocrine disorders: Secondary | ICD-10-CM

## 2022-10-27 DIAGNOSIS — E1169 Type 2 diabetes mellitus with other specified complication: Secondary | ICD-10-CM | POA: Diagnosis not present

## 2022-10-28 LAB — CBC WITH DIFFERENTIAL/PLATELET
Basophils Absolute: 0 10*3/uL (ref 0.0–0.2)
Basos: 0 %
EOS (ABSOLUTE): 0.2 10*3/uL (ref 0.0–0.4)
Eos: 2 %
Hematocrit: 49.9 % (ref 37.5–51.0)
Hemoglobin: 16.7 g/dL (ref 13.0–17.7)
Immature Grans (Abs): 0.1 10*3/uL (ref 0.0–0.1)
Immature Granulocytes: 1 %
Lymphocytes Absolute: 1.6 10*3/uL (ref 0.7–3.1)
Lymphs: 15 %
MCH: 30.4 pg (ref 26.6–33.0)
MCHC: 33.5 g/dL (ref 31.5–35.7)
MCV: 91 fL (ref 79–97)
Monocytes Absolute: 0.6 10*3/uL (ref 0.1–0.9)
Monocytes: 5 %
Neutrophils Absolute: 8.3 10*3/uL — ABNORMAL HIGH (ref 1.4–7.0)
Neutrophils: 77 %
Platelets: 318 10*3/uL (ref 150–450)
RBC: 5.5 x10E6/uL (ref 4.14–5.80)
RDW: 11.6 % (ref 11.6–15.4)
WBC: 10.7 10*3/uL (ref 3.4–10.8)

## 2022-10-28 LAB — COMPREHENSIVE METABOLIC PANEL
ALT: 13 IU/L (ref 0–44)
AST: 11 IU/L (ref 0–40)
Albumin/Globulin Ratio: 1.8
Albumin: 4.4 g/dL (ref 4.1–5.1)
Alkaline Phosphatase: 148 IU/L — ABNORMAL HIGH (ref 44–121)
BUN/Creatinine Ratio: 10 (ref 9–20)
BUN: 7 mg/dL (ref 6–24)
Bilirubin Total: 0.3 mg/dL (ref 0.0–1.2)
CO2: 24 mmol/L (ref 20–29)
Calcium: 9.3 mg/dL (ref 8.7–10.2)
Chloride: 94 mmol/L — ABNORMAL LOW (ref 96–106)
Creatinine, Ser: 0.73 mg/dL — ABNORMAL LOW (ref 0.76–1.27)
Globulin, Total: 2.4 g/dL (ref 1.5–4.5)
Glucose: 290 mg/dL — ABNORMAL HIGH (ref 70–99)
Potassium: 4 mmol/L (ref 3.5–5.2)
Sodium: 135 mmol/L (ref 134–144)
Total Protein: 6.8 g/dL (ref 6.0–8.5)
eGFR: 114 mL/min/{1.73_m2} (ref >59)

## 2022-10-28 LAB — HEMOGLOBIN A1C
Est. average glucose Bld gHb Est-mCnc: 358 mg/dL
Hgb A1c MFr Bld: 14.1 % — ABNORMAL HIGH (ref 4.8–5.6)

## 2022-10-28 LAB — LIPID PANEL
Chol/HDL Ratio: 3.8 ratio (ref 0.0–5.0)
Cholesterol, Total: 203 mg/dL — ABNORMAL HIGH (ref 100–199)
HDL: 54 mg/dL (ref >39)
LDL Chol Calc (NIH): 129 mg/dL — ABNORMAL HIGH (ref 0–99)
Triglycerides: 109 mg/dL (ref 0–149)
VLDL Cholesterol Cal: 20 mg/dL (ref 5–40)

## 2022-11-24 ENCOUNTER — Ambulatory Visit: Payer: Medicaid Other | Admitting: Family Medicine

## 2022-11-24 NOTE — Progress Notes (Deleted)
   Established Patient Office Visit  Subjective   Patient ID: Zachary Downs, male    DOB: 01/01/1976  Age: 47 y.o. MRN: [id number]  No chief complaint on file.   HPI Zachary Downs is a 47 y.o. male presenting today for follow up of diabetes.  At last appointment, A1c 14.1.  Patient agreed to referral to endocrinology which was sent to [facility].  He was also complaining of weight loss, cold intolerance, polydipsia/polyuria.  ROS Negative unless otherwise noted in HPI   Objective:     There were no vitals taken for this visit.  Physical Exam   No results found for any visits on 11/24/22.   Assessment & Plan:  There are no diagnoses linked to this encounter.  No follow-ups on file.    Melida Quitter, PA

## 2022-11-30 DIAGNOSIS — F411 Generalized anxiety disorder: Secondary | ICD-10-CM | POA: Diagnosis not present

## 2022-11-30 DIAGNOSIS — F4310 Post-traumatic stress disorder, unspecified: Secondary | ICD-10-CM | POA: Diagnosis not present

## 2023-01-04 ENCOUNTER — Other Ambulatory Visit: Payer: Self-pay | Admitting: Family Medicine

## 2023-01-04 DIAGNOSIS — E1165 Type 2 diabetes mellitus with hyperglycemia: Secondary | ICD-10-CM

## 2023-01-21 ENCOUNTER — Telehealth: Payer: Self-pay | Admitting: *Deleted

## 2023-01-21 DIAGNOSIS — E119 Type 2 diabetes mellitus without complications: Secondary | ICD-10-CM

## 2023-01-21 MED ORDER — OZEMPIC (0.25 OR 0.5 MG/DOSE) 2 MG/3ML ~~LOC~~ SOPN
0.5000 mg | PEN_INJECTOR | SUBCUTANEOUS | 2 refills | Status: AC
Start: 2023-01-21 — End: ?

## 2023-01-21 NOTE — Telephone Encounter (Signed)
Pt calling stating that he needs his Ozempic refilled and would like to go up to the next dose per what the pharmacy told him.    [id number] - [place] ([place]), [place] - [address]  [address], [place] ([place]) [place] [address]

## 2023-01-21 NOTE — Telephone Encounter (Signed)
Meds ordered this encounter  Medications   Semaglutide,0.25 or 0.5MG /DOS, (OZEMPIC, 0.25 OR 0.5 MG/DOSE,) 2 MG/3ML SOPN    Sig: Inject 0.5 mg into the skin once a week.    Dispense:  3 mL    Refill:  2    Order Specific Question:   Supervising Provider    Answer:   Nani Gasser D [[id number]]

## 2023-03-24 DIAGNOSIS — K6389 Other specified diseases of intestine: Secondary | ICD-10-CM | POA: Diagnosis not present

## 2023-03-24 DIAGNOSIS — R1084 Generalized abdominal pain: Secondary | ICD-10-CM | POA: Diagnosis not present

## 2023-03-24 DIAGNOSIS — K760 Fatty (change of) liver, not elsewhere classified: Secondary | ICD-10-CM | POA: Diagnosis not present

## 2023-03-24 DIAGNOSIS — E1165 Type 2 diabetes mellitus with hyperglycemia: Secondary | ICD-10-CM | POA: Diagnosis not present

## 2023-06-24 ENCOUNTER — Encounter: Payer: Self-pay | Admitting: Family Medicine

## 2023-07-23 DIAGNOSIS — F4310 Post-traumatic stress disorder, unspecified: Secondary | ICD-10-CM | POA: Diagnosis not present

## 2023-07-23 DIAGNOSIS — F411 Generalized anxiety disorder: Secondary | ICD-10-CM | POA: Diagnosis not present

## 2024-01-20 ENCOUNTER — Emergency Department

## 2024-01-20 ENCOUNTER — Encounter: Payer: Self-pay | Admitting: Emergency Medicine

## 2024-01-20 ENCOUNTER — Other Ambulatory Visit: Payer: Self-pay

## 2024-01-20 ENCOUNTER — Emergency Department
Admission: EM | Admit: 2024-01-20 | Discharge: 2024-01-20 | Disposition: A | Discharge status: Home / Self Care | Attending: Emergency Medicine | Admitting: Emergency Medicine

## 2024-01-20 DIAGNOSIS — R1084 Generalized abdominal pain: Secondary | ICD-10-CM

## 2024-01-20 DIAGNOSIS — J4489 Other specified chronic obstructive pulmonary disease: Secondary | ICD-10-CM | POA: Diagnosis not present

## 2024-01-20 DIAGNOSIS — E1165 Type 2 diabetes mellitus with hyperglycemia: Secondary | ICD-10-CM | POA: Insufficient documentation

## 2024-01-20 DIAGNOSIS — M545 Low back pain, unspecified: Secondary | ICD-10-CM | POA: Diagnosis not present

## 2024-01-20 DIAGNOSIS — R109 Unspecified abdominal pain: Secondary | ICD-10-CM | POA: Diagnosis present

## 2024-01-20 LAB — COMPREHENSIVE METABOLIC PANEL WITH GFR
ALT: 20 U/L (ref 0–44)
AST: 21 U/L (ref 15–41)
Albumin: 3.9 g/dL (ref 3.5–5.0)
Alkaline Phosphatase: 92 U/L (ref 38–126)
Anion gap: 13 (ref 5–15)
BUN: 11 mg/dL (ref 6–20)
CO2: 28 mmol/L (ref 22–32)
Calcium: 9.2 mg/dL (ref 8.9–10.3)
Chloride: 93 mmol/L — ABNORMAL LOW (ref 98–111)
Creatinine, Ser: 0.84 mg/dL (ref 0.61–1.24)
GFR, Estimated: >60 mL/min (ref >60)
Glucose, Bld: 397 mg/dL — ABNORMAL HIGH (ref 70–99)
Potassium: 3.9 mmol/L (ref 3.5–5.1)
Sodium: 134 mmol/L — ABNORMAL LOW (ref 135–145)
Total Bilirubin: 0.4 mg/dL (ref 0.0–1.2)
Total Protein: 7.1 g/dL (ref 6.5–8.1)

## 2024-01-20 LAB — URINALYSIS, ROUTINE W REFLEX MICROSCOPIC
Bacteria, UA: NONE SEEN
Bilirubin Urine: NEGATIVE
Glucose, UA: >=500 mg/dL — AB
Hgb urine dipstick: NEGATIVE
Ketones, ur: NEGATIVE mg/dL
Leukocytes,Ua: NEGATIVE
Nitrite: NEGATIVE
Protein, ur: 30 mg/dL — AB
Specific Gravity, Urine: 1.009 (ref 1.005–1.030)
Squamous Epithelial / HPF: 0 /HPF (ref 0–5)
pH: 6 (ref 5.0–8.0)

## 2024-01-20 LAB — CBC
HCT: 49.8 % (ref 39.0–52.0)
Hemoglobin: 17 g/dL (ref 13.0–17.0)
MCH: 31.2 pg (ref 26.0–34.0)
MCHC: 34.1 g/dL (ref 30.0–36.0)
MCV: 91.4 fL (ref 80.0–100.0)
Platelets: 282 K/uL (ref 150–400)
RBC: 5.45 MIL/uL (ref 4.22–5.81)
RDW: 12.8 % (ref 11.5–15.5)
WBC: 9.4 K/uL (ref 4.0–10.5)
nRBC: 0 % (ref 0.0–0.2)

## 2024-01-20 LAB — LIPASE, BLOOD: Lipase: 49 U/L (ref 11–51)

## 2024-01-20 MED ORDER — GLIPIZIDE 5 MG PO TABS: 5.0000 mg | ORAL_TABLET | Freq: Every day | ORAL | 2 refills | Status: AC

## 2024-01-20 MED ORDER — IOHEXOL 300 MG/ML  SOLN
100.0000 mL | Freq: Once | INTRAMUSCULAR | Status: AC | PRN
  Administered 2024-01-20: 100 mL via INTRAVENOUS

## 2024-01-20 MED ORDER — KETOROLAC TROMETHAMINE 15 MG/ML IJ SOLN
15.0000 mg | Freq: Once | INTRAMUSCULAR | Status: AC
  Administered 2024-01-20: 15 mg via INTRAVENOUS
  Filled 2024-01-20: qty 1

## 2024-01-20 MED ORDER — SODIUM CHLORIDE 0.9 % IV BOLUS
1000.0000 mL | Freq: Once | INTRAVENOUS | Status: AC
  Administered 2024-01-20: 1000 mL via INTRAVENOUS

## 2024-01-20 NOTE — ED Triage Notes (Signed)
 Pt reports right and left sided flank pain that is throbbing. Pt reports the pain started 1 month ago but today is getting worse. PT does reports some nausea.

## 2024-01-20 NOTE — ED Provider Notes (Signed)
 [facility] Provider Note    Event Date/Time   First MD Initiated Contact with Patient 01/20/24 [phone]     (approximate)   History   Chief Complaint: Abdominal Pain   HPI  Zachary Downs is a 48 y.o. male with a history of COPD, diabetes who comes ED complaining of bilateral low back pain which started gradually 1 month ago but has been persistent.  Also reports some nausea.  Pain is nonradiating.  No lower extremity paresthesia or weakness.  Does report loss of appetite.  No dysuria.  No fever.        Past Medical History:  Diagnosis Date   Asthma    COPD (chronic obstructive pulmonary disease) (HCC)    Glaucoma    H/O emphysema [facility])     Current Outpatient Rx   Order #: [id number] Class: Print   Order #: [id number] Class: Normal   Order #: [id number] Class: Historical Med   Order #: [id number] Class: Historical Med   Order #: [id number] Class: Historical Med   Order #: [id number] Class: Normal   Order #: [id number] Class: Historical Med   Order #: [id number] Class: Normal   Order #: [id number] Class: Normal   Order #: [id number] Class: Normal   Order #: [id number] Class: Normal   Order #: [id number] Class: Normal    Past Surgical History:  Procedure Laterality Date   KIDNEY SURGERY      Physical Exam   Triage Vital Signs: ED Triage Vitals  Encounter Vitals Group     BP 01/20/24 0908 139/77     Girls Systolic BP Percentile --      Girls Diastolic BP Percentile --      Boys Systolic BP Percentile --      Boys Diastolic BP Percentile --      Pulse Rate 01/20/24 0908 (!) 103     Resp 01/20/24 0908 18     Temp 01/20/24 0908 (!) 97.5 F (36.4 C)     Temp Source 01/20/24 0908 Oral     SpO2 01/20/24 0908 97 %     Weight 01/20/24 0909 160 lb (72.6 kg)     Height 01/20/24 0909 5' 5 (1.651 m)     Head Circumference --      Peak Flow --      Pain Score 01/20/24 0908 7     Pain Loc --      Pain Education --      Exclude from Growth Chart --     Most  recent vital signs: Vitals:   01/20/24 1038 01/20/24 1338  BP: 114/71   Pulse: 82   Resp: 18   Temp:  97.7 F (36.5 C)  SpO2: 96%     General: Awake, no distress.  CV:  Good peripheral perfusion.  Regular rate rhythm Resp:  Normal effort.  Clear lungs Abd:  No distention.  Soft with mild generalized tenderness Other:  No lower extremity edema.  Moist oral mucosa   ED Results / Procedures / Treatments   Labs (all labs ordered are listed, but only abnormal results are displayed) Labs Reviewed  COMPREHENSIVE METABOLIC PANEL WITH GFR - Abnormal; Notable for the following components:      Result Value   Sodium 134 (*)    Chloride 93 (*)    Glucose, Bld 397 (*)    All other components within normal limits  URINALYSIS, ROUTINE W REFLEX MICROSCOPIC - Abnormal; Notable for the following components:   Color, Urine STRAW (*)    APPearance CLEAR (*)  Glucose, UA >=500 (*)    Protein, ur 30 (*)    All other components within normal limits  LIPASE, BLOOD  CBC     EKG    RADIOLOGY CT abdomen pelvis pending   PROCEDURES:  Procedures   MEDICATIONS ORDERED IN ED: Medications  sodium chloride  0.9 % bolus 1,000 mL (0 mLs Intravenous Stopped 01/20/24 1335)  ketorolac  (TORADOL ) 15 MG/ML injection 15 mg (15 mg Intravenous Given 01/20/24 1039)  iohexol  (OMNIPAQUE ) 300 MG/ML solution 100 mL (100 mLs Intravenous Contrast Given 01/20/24 1224)     IMPRESSION / MDM / ASSESSMENT AND PLAN / ED COURSE  I reviewed the triage vital signs and the nursing notes.  DDx: Appendicitis, colitis, intra-abdominal abscess, kidney stone, functional pain, constipation, colon mass  Patient's presentation is most consistent with acute presentation with potential threat to life or bodily function.  Patient presents with low back pain for more than a month, with diffuse abdominal tenderness.  He is diabetic.  Serum labs and urinalysis unremarkable except for hyperglycemia and glucosuria.  Will obtain  CT abdomen pelvis.   ----------------------------------------- 1:40 PM on 01/20/2024 ----------------------------------------- Labs and CT unremarkable.  Suspect this is related to hyperglycemia.  Patient reports poor experience with Ozempic  and not willing to take that anymore and intolerance to Jardiance and metformin in the past.  Will try glipizide , referral to new PCP placed.      FINAL CLINICAL IMPRESSION(S) / ED DIAGNOSES   Final diagnoses:  Bilateral low back pain without sciatica, unspecified chronicity  Generalized abdominal pain  Type 2 diabetes mellitus with hyperglycemia, without long-term current use of insulin (HCC)     Rx / DC Orders   ED Discharge Orders          Ordered    Ambulatory Referral to Primary Care (Establish Care)        01/20/24 1334    glipiZIDE  (GLUCOTROL ) 5 MG tablet  Daily before breakfast        01/20/24 1339             Note:  This document was prepared using Dragon voice recognition software and may include unintentional dictation errors.   Viviann Pastor, MD 01/20/24 1340
